# Patient Record
Sex: Female | Born: 1980 | Race: Black or African American | Hispanic: No | Marital: Married | Smoking: Never smoker
Health system: Southern US, Community
[De-identification: ages and names within clinical notes are randomized; demographics above are authoritative.]

## PROBLEM LIST (undated history)

## (undated) DIAGNOSIS — I10 Essential (primary) hypertension: Secondary | ICD-10-CM

## (undated) HISTORY — PX: MOUTH SURGERY: SHX715

---

## 2016-09-22 NOTE — L&D Delivery Note (Signed)
Patient is a 36 y.o. now G2P2003 who was admitted for IOL for pre-eclapsia, now s/p NSVD of twins at 6659w2d. S/p BMZ x 2. S/p cytotec, oxytocin, and AROM of twin A at 20:30 on 05/23/17. Received IV mag during labor.    [name], GirlA Emily Wilkinson [[id number]]  Delivery Note At 12:30 AM a viable female was delivered via Vaginal, Spontaneous Delivery (Presentation: ROA).  APGAR: 9, 9; weight 2231 g  Placenta status: intact; to pathology   Cord:  3-vessel.  Cord pH: pending  Anesthesia:  Epidural Episiotomy: None Lacerations: None Suture Repair: none Est. Blood Loss (mL): 150    [name], GirlB [name] [[id number]][[id number]]  Delivery Note At 12:56 AM a viable female was delivered via Vaginal, Vacuum (Extractor) (Presentation: ROA).  APGAR: 4, 8; weight  2129 g  Placenta status: intact   Cord:  3-vessel  Cord pH: pending  Anesthesia:  Epidural Episiotomy: None Lacerations: None Suture Repair: none Est. Blood Loss (mL): 150  Patient found to be complete, and with maternal push, Twin A delivered vertex. Head delivered ROA. No nuchal cord present. Shoulder and body delivered in usual fashion. Infant to mother's abdomen, vigorous and crying. Cord clamped and cut after 1-minute delay. Cord pH and cord blood drawn.  U/S done and Twin B found to be breech. ECV done by Dr. Despina HiddenEure. FHT reassuring. AROM of Twin B done. Mother pushed with next few contractions with minimal descent. Vacuum-assisted delivery of fetal head done, and shoulders and body delivered in usual fashion. Infant floppy and w/o spontaneous cry. Cord and clamped and cut, and infant handed to awaiting neonatology team. Cord pH and cord blood drawn.  Placenta delivered intact with gentle cord traction; 2-umbilical cords, both with 3-vessel. Fundus firm with massage and Pitocin. Perineum inspected and found to have no lacerations  Mom to postpartum.  Baby to mother's room after evaluation by neonatology team (see neonatology attendance note).  Raynelle FanningJulie P.  Shantele Reller, MD OB Fellow 05/24/17, 1:26 AM

## 2016-12-04 LAB — OB RESULTS CONSOLE GC/CHLAMYDIA
Chlamydia: NEGATIVE
GC PROBE AMP, GENITAL: NEGATIVE

## 2016-12-04 LAB — OB RESULTS CONSOLE HGB/HCT, BLOOD
HEMATOCRIT: 38
HEMOGLOBIN: 12.4

## 2016-12-04 LAB — OB RESULTS CONSOLE ABO/RH
ABO/RH(D): O POS
RH Type: POSITIVE

## 2016-12-04 LAB — OB RESULTS CONSOLE HIV ANTIBODY (ROUTINE TESTING): HIV: NONREACTIVE

## 2016-12-04 LAB — OB RESULTS CONSOLE HEPATITIS B SURFACE ANTIGEN: HEP B S AG: NEGATIVE

## 2016-12-04 LAB — OB RESULTS CONSOLE RUBELLA ANTIBODY, IGM: RUBELLA: IMMUNE

## 2016-12-04 LAB — OB RESULTS CONSOLE PLATELET COUNT: Platelets: 327

## 2016-12-04 LAB — OB RESULTS CONSOLE RPR: RPR: NONREACTIVE

## 2016-12-16 ENCOUNTER — Other Ambulatory Visit (HOSPITAL_COMMUNITY): Payer: Self-pay | Admitting: Specialist

## 2016-12-16 DIAGNOSIS — O09521 Supervision of elderly multigravida, first trimester: Secondary | ICD-10-CM

## 2016-12-16 DIAGNOSIS — Z3689 Encounter for other specified antenatal screening: Principal | ICD-10-CM

## 2016-12-16 DIAGNOSIS — O30009 Twin pregnancy, unspecified number of placenta and unspecified number of amniotic sacs, unspecified trimester: Secondary | ICD-10-CM

## 2016-12-16 DIAGNOSIS — O10919 Unspecified pre-existing hypertension complicating pregnancy, unspecified trimester: Secondary | ICD-10-CM

## 2017-01-23 ENCOUNTER — Encounter (HOSPITAL_COMMUNITY): Payer: Self-pay | Admitting: *Deleted

## 2017-01-27 ENCOUNTER — Ambulatory Visit (HOSPITAL_COMMUNITY)
Admission: RE | Admit: 2017-01-27 | Discharge: 2017-01-27 | Disposition: A | Discharge status: Home / Self Care | Payer: BLUE CROSS/BLUE SHIELD | Source: Ambulatory Visit | Attending: Specialist | Admitting: Specialist

## 2017-01-27 ENCOUNTER — Other Ambulatory Visit (HOSPITAL_COMMUNITY): Payer: Self-pay | Admitting: Specialist

## 2017-01-27 ENCOUNTER — Encounter (HOSPITAL_COMMUNITY): Payer: Self-pay

## 2017-01-27 DIAGNOSIS — O10919 Unspecified pre-existing hypertension complicating pregnancy, unspecified trimester: Secondary | ICD-10-CM

## 2017-01-27 DIAGNOSIS — O09521 Supervision of elderly multigravida, first trimester: Secondary | ICD-10-CM

## 2017-01-27 DIAGNOSIS — O30009 Twin pregnancy, unspecified number of placenta and unspecified number of amniotic sacs, unspecified trimester: Secondary | ICD-10-CM

## 2017-01-27 DIAGNOSIS — Z3689 Encounter for other specified antenatal screening: Secondary | ICD-10-CM

## 2017-01-27 DIAGNOSIS — O09522 Supervision of elderly multigravida, second trimester: Secondary | ICD-10-CM

## 2017-01-27 DIAGNOSIS — Z3A18 18 weeks gestation of pregnancy: Secondary | ICD-10-CM | POA: Insufficient documentation

## 2017-01-27 DIAGNOSIS — O30032 Twin pregnancy, monochorionic/diamniotic, second trimester: Secondary | ICD-10-CM

## 2017-01-27 HISTORY — DX: Essential (primary) hypertension: I10

## 2017-01-27 NOTE — Progress Notes (Signed)
Genetic Counseling  High-Risk Gestation Note  Appointment Date:  01/27/2017 Referred By: Jeannett Senior, MD Date of Birth:  07-Nov-1980 Partner:  Jolaine Artist   Pregnancy History: [id number] Estimated Date of Delivery: 06/26/17 Estimated Gestational Age: [redacted]w[redacted]d Attending: Particia Nearing, MD  Mrs. Viviann Spare and her husband, Mr. Terissa Haffey, were seen for genetic counseling because of a maternal age of 69.     In summary:  Discussed AMA and associated risk for fetal aneuploidy  Discussed options for screening  Quad screen-declined  NIPS-Panorama performed today  Ultrasound-performed today; no anomalies or markers for aneuploidy seen  Discussed diagnostic testing options  Amniocentesis-declined  Reviewed family history concerns  Discussed carrier screening options  CF, SMA, and hemoglobinopathies-declined  They were counseled regarding maternal age and the association with risk for chromosome conditions due to nondisjunction with aging of the ova.  This is a monochorionic-diamniotic twin pregnancy. We reviewed chromosomes, nondisjunction, and the associated 1 in 56 risk for fetal aneuploidy related to a maternal age of 10 at [redacted]w[redacted]d gestation. They were counseled that the risk for aneuploidy decreases as gestational age increases, accounting for those pregnancies which spontaneously abort.  We specifically discussed Down syndrome (trisomy 51), trisomies 65 and 64, and sex chromosome aneuploidies (47,XXX and 47,XXY) including the common features and prognoses of each.   We reviewed available screening options including Quad screen, noninvasive prenatal screening (NIPS)/cell free DNA (cfDNA) screening, and detailed ultrasound. They were counseled that screening tests are used to modify a patient's a priori risk for aneuploidy, typically based on age. This estimate provides a pregnancy specific risk assessment. We reviewed the benefits and limitations of each option. Specifically, we discussed the  conditions for which each test screens, the detection rates, and false positive rates of each. They were also counseled regarding diagnostic testing via amniocentesis. We reviewed the approximate 1 in 500 risk for complications from amniocentesis, including spontaneous pregnancy loss. We discussed the possible results that the tests might provide including: positive, negative, unanticipated, and no result. Finally, they were counseled regarding the cost of each option and potential out of pocket expenses. After consideration of all the options, they elected to proceed with NIPS. Those results will be available in 5-7 days.    The patient also expressed interest in having a detailed ultrasound. A complete ultrasound was performed today. The ultrasound report will be documented separately. There were no visualized fetal anomalies or markers suggestive of aneuploidy. Diagnostic testing was declined today. They understand that screening tests cannot rule out all birth defects or genetic syndromes. The patient was advised of this limitation and states she still does not want additional testing at this time.   Mrs. Ilg was provided with written information regarding cystic fibrosis (CF), spinal muscular atrophy (SMA) and hemoglobinopathies including the carrier frequency, availability of carrier screening and prenatal diagnosis if indicated.  In addition, we discussed that CF and hemoglobinopathies are routinely screened for as part of the [facility] newborn screening panel.  After further discussion, she declined screening for CF, SMA and hemoglobinopathies.  Both family histories were reviewed and found to be noncontributory for birth defects, intellectual disability, and known genetic conditions. Without further information regarding the provided family history, an accurate genetic risk cannot be calculated. Further genetic counseling is warranted if more information is obtained.  Mrs. Urbanski denied exposure to  environmental toxins or chemical agents. She denied the use of alcohol, tobacco or street drugs. She denied significant viral illnesses during the course of her pregnancy.  Her medical and surgical histories were noncontributory.   I counseled this couple regarding the above risks and available options.  The approximate face-to-face time with the genetic counselor was 39 minutes.  Donald Prosehristy S. Amaro Mangold, MS Certified Genetic Counselor

## 2017-01-28 ENCOUNTER — Other Ambulatory Visit (HOSPITAL_COMMUNITY): Payer: Self-pay | Admitting: *Deleted

## 2017-01-28 DIAGNOSIS — O30032 Twin pregnancy, monochorionic/diamniotic, second trimester: Secondary | ICD-10-CM

## 2017-01-30 ENCOUNTER — Encounter (HOSPITAL_COMMUNITY): Payer: Self-pay

## 2017-01-30 ENCOUNTER — Other Ambulatory Visit: Payer: Self-pay

## 2017-02-03 ENCOUNTER — Other Ambulatory Visit: Payer: Self-pay

## 2017-02-05 ENCOUNTER — Telehealth (HOSPITAL_COMMUNITY): Payer: Self-pay

## 2017-02-05 NOTE — Telephone Encounter (Signed)
Called Viviann SpareShalonda Hefley to discuss her prenatal cell free DNA test results. Mrs. Viviann SpareShalonda Gradilla had Panorama testing through [facility].  Testing was offered because of a maternal age of 36. The patient was identified by name and DOB.  We reviewed that these are within normal limits, showing a less than 1 in 10,000 risk for trisomies 21, 18 and 13, and monosomy X (Turner syndrome).  In addition, the risk for sex chromosome trisomies (47,XXX and 47,XXY) was also low risk.  Mrs. Brooke DareKing elected to have zygosity testing, which showed that the twins are monozygotic and both female. We reviewed the accuracy of the testing. She understands that this testing does not identify all genetic conditions. All questions were answered to her satisfaction, she was encouraged to call with additional questions or concerns.  Donald Prosehristy S. Neeraj Housand, MS Certified Genetic Counselor

## 2017-02-10 ENCOUNTER — Ambulatory Visit (HOSPITAL_COMMUNITY)
Admission: RE | Admit: 2017-02-10 | Discharge: 2017-02-10 | Disposition: A | Discharge status: Home / Self Care | Payer: BLUE CROSS/BLUE SHIELD | Source: Ambulatory Visit | Attending: Specialist | Admitting: Specialist

## 2017-02-10 ENCOUNTER — Encounter (HOSPITAL_COMMUNITY): Payer: Self-pay

## 2017-02-10 ENCOUNTER — Other Ambulatory Visit (HOSPITAL_COMMUNITY): Payer: Self-pay | Admitting: Maternal and Fetal Medicine

## 2017-02-10 DIAGNOSIS — Z3A20 20 weeks gestation of pregnancy: Secondary | ICD-10-CM

## 2017-02-10 DIAGNOSIS — O30032 Twin pregnancy, monochorionic/diamniotic, second trimester: Secondary | ICD-10-CM | POA: Insufficient documentation

## 2017-02-10 DIAGNOSIS — O10919 Unspecified pre-existing hypertension complicating pregnancy, unspecified trimester: Secondary | ICD-10-CM

## 2017-02-10 DIAGNOSIS — O09522 Supervision of elderly multigravida, second trimester: Secondary | ICD-10-CM | POA: Insufficient documentation

## 2017-02-10 DIAGNOSIS — O10012 Pre-existing essential hypertension complicating pregnancy, second trimester: Secondary | ICD-10-CM | POA: Insufficient documentation

## 2017-02-10 NOTE — Addendum Note (Signed)
Encounter addended by: Vivien RotaSmall, Kostantinos Tallman H, RT on: 02/10/2017  9:42 AM<BR>    Actions taken: Imaging Exam ended

## 2017-02-24 ENCOUNTER — Other Ambulatory Visit (HOSPITAL_COMMUNITY): Payer: Self-pay

## 2017-02-24 ENCOUNTER — Encounter (HOSPITAL_COMMUNITY): Payer: Self-pay

## 2017-02-27 ENCOUNTER — Encounter (HOSPITAL_COMMUNITY): Payer: Self-pay

## 2017-02-27 ENCOUNTER — Other Ambulatory Visit (HOSPITAL_COMMUNITY): Payer: Self-pay | Admitting: *Deleted

## 2017-02-27 ENCOUNTER — Ambulatory Visit (HOSPITAL_COMMUNITY)
Admission: RE | Admit: 2017-02-27 | Discharge: 2017-02-27 | Disposition: A | Discharge status: Home / Self Care | Payer: BLUE CROSS/BLUE SHIELD | Source: Ambulatory Visit | Attending: Specialist | Admitting: Specialist

## 2017-02-27 DIAGNOSIS — Z3A23 23 weeks gestation of pregnancy: Secondary | ICD-10-CM | POA: Diagnosis not present

## 2017-02-27 DIAGNOSIS — O09522 Supervision of elderly multigravida, second trimester: Secondary | ICD-10-CM | POA: Insufficient documentation

## 2017-02-27 DIAGNOSIS — O10012 Pre-existing essential hypertension complicating pregnancy, second trimester: Secondary | ICD-10-CM | POA: Diagnosis not present

## 2017-02-27 DIAGNOSIS — O30032 Twin pregnancy, monochorionic/diamniotic, second trimester: Secondary | ICD-10-CM | POA: Insufficient documentation

## 2017-02-27 DIAGNOSIS — O30039 Twin pregnancy, monochorionic/diamniotic, unspecified trimester: Secondary | ICD-10-CM

## 2017-03-13 ENCOUNTER — Ambulatory Visit (HOSPITAL_COMMUNITY)
Admission: RE | Admit: 2017-03-13 | Discharge: 2017-03-13 | Disposition: A | Discharge status: Home / Self Care | Payer: BLUE CROSS/BLUE SHIELD | Source: Ambulatory Visit | Attending: Specialist | Admitting: Specialist

## 2017-03-13 ENCOUNTER — Other Ambulatory Visit (HOSPITAL_COMMUNITY): Payer: BLUE CROSS/BLUE SHIELD

## 2017-03-13 ENCOUNTER — Encounter (HOSPITAL_COMMUNITY): Payer: Self-pay

## 2017-03-13 ENCOUNTER — Other Ambulatory Visit (HOSPITAL_COMMUNITY): Payer: Self-pay | Admitting: Maternal & Fetal Medicine

## 2017-03-13 DIAGNOSIS — O09522 Supervision of elderly multigravida, second trimester: Secondary | ICD-10-CM

## 2017-03-13 DIAGNOSIS — O30032 Twin pregnancy, monochorionic/diamniotic, second trimester: Secondary | ICD-10-CM | POA: Insufficient documentation

## 2017-03-13 DIAGNOSIS — Z3A25 25 weeks gestation of pregnancy: Secondary | ICD-10-CM | POA: Diagnosis not present

## 2017-03-13 DIAGNOSIS — O30039 Twin pregnancy, monochorionic/diamniotic, unspecified trimester: Secondary | ICD-10-CM

## 2017-03-13 DIAGNOSIS — O162 Unspecified maternal hypertension, second trimester: Secondary | ICD-10-CM

## 2017-03-27 ENCOUNTER — Ambulatory Visit (HOSPITAL_COMMUNITY)
Admission: RE | Admit: 2017-03-27 | Discharge: 2017-03-27 | Disposition: A | Discharge status: Home / Self Care | Payer: BLUE CROSS/BLUE SHIELD | Source: Ambulatory Visit | Attending: Specialist | Admitting: Specialist

## 2017-03-27 ENCOUNTER — Encounter (HOSPITAL_COMMUNITY): Payer: Self-pay

## 2017-03-27 DIAGNOSIS — Z362 Encounter for other antenatal screening follow-up: Secondary | ICD-10-CM | POA: Diagnosis not present

## 2017-03-27 DIAGNOSIS — O321XX2 Maternal care for breech presentation, fetus 2: Secondary | ICD-10-CM | POA: Diagnosis not present

## 2017-03-27 DIAGNOSIS — Z3A27 27 weeks gestation of pregnancy: Secondary | ICD-10-CM | POA: Diagnosis not present

## 2017-03-27 DIAGNOSIS — O30039 Twin pregnancy, monochorionic/diamniotic, unspecified trimester: Secondary | ICD-10-CM

## 2017-03-27 DIAGNOSIS — O30032 Twin pregnancy, monochorionic/diamniotic, second trimester: Secondary | ICD-10-CM | POA: Insufficient documentation

## 2017-03-27 DIAGNOSIS — O10019 Pre-existing essential hypertension complicating pregnancy, unspecified trimester: Secondary | ICD-10-CM | POA: Insufficient documentation

## 2017-03-27 DIAGNOSIS — O09522 Supervision of elderly multigravida, second trimester: Secondary | ICD-10-CM | POA: Diagnosis not present

## 2017-04-06 LAB — OB RESULTS CONSOLE HIV ANTIBODY (ROUTINE TESTING): HIV: NONREACTIVE

## 2017-04-06 LAB — OB RESULTS CONSOLE RPR: RPR: NONREACTIVE

## 2017-04-10 ENCOUNTER — Encounter (HOSPITAL_COMMUNITY): Payer: Self-pay

## 2017-04-10 ENCOUNTER — Ambulatory Visit (HOSPITAL_COMMUNITY): Payer: BLUE CROSS/BLUE SHIELD

## 2017-04-10 ENCOUNTER — Ambulatory Visit (HOSPITAL_COMMUNITY)
Admission: RE | Admit: 2017-04-10 | Discharge: 2017-04-10 | Disposition: A | Discharge status: Home / Self Care | Payer: BLUE CROSS/BLUE SHIELD | Source: Ambulatory Visit | Attending: Specialist | Admitting: Specialist

## 2017-04-10 DIAGNOSIS — O10013 Pre-existing essential hypertension complicating pregnancy, third trimester: Secondary | ICD-10-CM | POA: Diagnosis not present

## 2017-04-10 DIAGNOSIS — O30032 Twin pregnancy, monochorionic/diamniotic, second trimester: Secondary | ICD-10-CM | POA: Insufficient documentation

## 2017-04-10 DIAGNOSIS — O09523 Supervision of elderly multigravida, third trimester: Secondary | ICD-10-CM | POA: Diagnosis not present

## 2017-04-10 DIAGNOSIS — O30039 Twin pregnancy, monochorionic/diamniotic, unspecified trimester: Secondary | ICD-10-CM

## 2017-04-10 DIAGNOSIS — Z3A29 29 weeks gestation of pregnancy: Secondary | ICD-10-CM | POA: Diagnosis not present

## 2017-04-10 DIAGNOSIS — Z362 Encounter for other antenatal screening follow-up: Secondary | ICD-10-CM | POA: Insufficient documentation

## 2017-04-22 ENCOUNTER — Encounter (HOSPITAL_COMMUNITY): Payer: Self-pay

## 2017-04-22 ENCOUNTER — Ambulatory Visit (HOSPITAL_COMMUNITY)
Admission: RE | Admit: 2017-04-22 | Discharge: 2017-04-22 | Disposition: A | Discharge status: Home / Self Care | Payer: BLUE CROSS/BLUE SHIELD | Source: Ambulatory Visit | Attending: Maternal & Fetal Medicine | Admitting: Maternal & Fetal Medicine

## 2017-04-22 ENCOUNTER — Other Ambulatory Visit (HOSPITAL_COMMUNITY): Payer: Self-pay | Admitting: Maternal & Fetal Medicine

## 2017-04-22 DIAGNOSIS — O10013 Pre-existing essential hypertension complicating pregnancy, third trimester: Secondary | ICD-10-CM | POA: Insufficient documentation

## 2017-04-22 DIAGNOSIS — O30039 Twin pregnancy, monochorionic/diamniotic, unspecified trimester: Secondary | ICD-10-CM

## 2017-04-22 DIAGNOSIS — O30033 Twin pregnancy, monochorionic/diamniotic, third trimester: Secondary | ICD-10-CM | POA: Insufficient documentation

## 2017-04-22 DIAGNOSIS — Z3A30 30 weeks gestation of pregnancy: Secondary | ICD-10-CM

## 2017-04-22 DIAGNOSIS — O09523 Supervision of elderly multigravida, third trimester: Secondary | ICD-10-CM | POA: Insufficient documentation

## 2017-04-23 ENCOUNTER — Ambulatory Visit (HOSPITAL_COMMUNITY): Payer: BLUE CROSS/BLUE SHIELD

## 2017-04-24 ENCOUNTER — Ambulatory Visit (HOSPITAL_COMMUNITY): Payer: BLUE CROSS/BLUE SHIELD

## 2017-05-08 ENCOUNTER — Encounter (HOSPITAL_COMMUNITY): Payer: Self-pay

## 2017-05-08 ENCOUNTER — Ambulatory Visit (HOSPITAL_COMMUNITY): Payer: BLUE CROSS/BLUE SHIELD

## 2017-05-08 ENCOUNTER — Ambulatory Visit (HOSPITAL_COMMUNITY)
Admission: RE | Admit: 2017-05-08 | Discharge: 2017-05-08 | Disposition: A | Discharge status: Home / Self Care | Payer: BLUE CROSS/BLUE SHIELD | Source: Ambulatory Visit | Attending: Specialist | Admitting: Specialist

## 2017-05-08 DIAGNOSIS — O30039 Twin pregnancy, monochorionic/diamniotic, unspecified trimester: Secondary | ICD-10-CM

## 2017-05-08 DIAGNOSIS — O09523 Supervision of elderly multigravida, third trimester: Secondary | ICD-10-CM | POA: Diagnosis not present

## 2017-05-08 DIAGNOSIS — O321XX1 Maternal care for breech presentation, fetus 1: Secondary | ICD-10-CM | POA: Diagnosis not present

## 2017-05-08 DIAGNOSIS — Z3A33 33 weeks gestation of pregnancy: Secondary | ICD-10-CM | POA: Diagnosis not present

## 2017-05-08 DIAGNOSIS — O10013 Pre-existing essential hypertension complicating pregnancy, third trimester: Secondary | ICD-10-CM | POA: Diagnosis present

## 2017-05-08 DIAGNOSIS — O30033 Twin pregnancy, monochorionic/diamniotic, third trimester: Secondary | ICD-10-CM | POA: Diagnosis present

## 2017-05-08 NOTE — Addendum Note (Signed)
Encounter addended by: Drue Novel, RDMS on: 05/08/2017  4:19 PM<BR>    Actions taken: Imaging Exam ended

## 2017-05-22 ENCOUNTER — Inpatient Hospital Stay (HOSPITAL_COMMUNITY)
Admission: AD | Admit: 2017-05-22 | Discharge: 2017-05-25 | DRG: 767 | Disposition: A | Discharge status: Home / Self Care | Payer: BLUE CROSS/BLUE SHIELD | Source: Ambulatory Visit | Attending: Obstetrics & Gynecology | Admitting: Obstetrics & Gynecology

## 2017-05-22 ENCOUNTER — Ambulatory Visit (HOSPITAL_COMMUNITY): Payer: BLUE CROSS/BLUE SHIELD

## 2017-05-22 ENCOUNTER — Encounter (HOSPITAL_COMMUNITY): Payer: Self-pay

## 2017-05-22 ENCOUNTER — Other Ambulatory Visit (HOSPITAL_COMMUNITY): Payer: Self-pay | Admitting: Maternal & Fetal Medicine

## 2017-05-22 ENCOUNTER — Ambulatory Visit (HOSPITAL_COMMUNITY)
Admission: RE | Admit: 2017-05-22 | Discharge: 2017-05-22 | Disposition: A | Discharge status: Home / Self Care | Payer: BLUE CROSS/BLUE SHIELD | Source: Ambulatory Visit | Attending: Maternal & Fetal Medicine | Admitting: Maternal & Fetal Medicine

## 2017-05-22 DIAGNOSIS — O30009 Twin pregnancy, unspecified number of placenta and unspecified number of amniotic sacs, unspecified trimester: Secondary | ICD-10-CM

## 2017-05-22 DIAGNOSIS — O1002 Pre-existing essential hypertension complicating childbirth: Secondary | ICD-10-CM | POA: Diagnosis present

## 2017-05-22 DIAGNOSIS — Z302 Encounter for sterilization: Secondary | ICD-10-CM | POA: Diagnosis not present

## 2017-05-22 DIAGNOSIS — O114 Pre-existing hypertension with pre-eclampsia, complicating childbirth: Secondary | ICD-10-CM | POA: Diagnosis present

## 2017-05-22 DIAGNOSIS — O1494 Unspecified pre-eclampsia, complicating childbirth: Secondary | ICD-10-CM | POA: Diagnosis present

## 2017-05-22 DIAGNOSIS — O30033 Twin pregnancy, monochorionic/diamniotic, third trimester: Secondary | ICD-10-CM | POA: Diagnosis present

## 2017-05-22 DIAGNOSIS — O10019 Pre-existing essential hypertension complicating pregnancy, unspecified trimester: Secondary | ICD-10-CM

## 2017-05-22 DIAGNOSIS — O30043 Twin pregnancy, dichorionic/diamniotic, third trimester: Secondary | ICD-10-CM | POA: Diagnosis not present

## 2017-05-22 DIAGNOSIS — Z3A49 Greater than 42 weeks gestation of pregnancy: Secondary | ICD-10-CM | POA: Diagnosis not present

## 2017-05-22 DIAGNOSIS — Z3A35 35 weeks gestation of pregnancy: Secondary | ICD-10-CM

## 2017-05-22 DIAGNOSIS — O30039 Twin pregnancy, monochorionic/diamniotic, unspecified trimester: Secondary | ICD-10-CM

## 2017-05-22 DIAGNOSIS — O09523 Supervision of elderly multigravida, third trimester: Secondary | ICD-10-CM | POA: Diagnosis present

## 2017-05-22 DIAGNOSIS — O321XX2 Maternal care for breech presentation, fetus 2: Secondary | ICD-10-CM | POA: Diagnosis present

## 2017-05-22 DIAGNOSIS — O1414 Severe pre-eclampsia complicating childbirth: Secondary | ICD-10-CM | POA: Diagnosis not present

## 2017-05-22 DIAGNOSIS — O1410 Severe pre-eclampsia, unspecified trimester: Secondary | ICD-10-CM | POA: Diagnosis present

## 2017-05-22 DIAGNOSIS — Z3490 Encounter for supervision of normal pregnancy, unspecified, unspecified trimester: Secondary | ICD-10-CM | POA: Diagnosis present

## 2017-05-22 LAB — COMPREHENSIVE METABOLIC PANEL
ALK PHOS: 191 U/L — AB (ref 38–126)
ALT: 16 U/L (ref 14–54)
AST: 23 U/L (ref 15–41)
Albumin: 2.9 g/dL — ABNORMAL LOW (ref 3.5–5.0)
Anion gap: 9 (ref 5–15)
BUN: 8 mg/dL (ref 6–20)
CALCIUM: 9.2 mg/dL (ref 8.9–10.3)
CO2: 19 mmol/L — ABNORMAL LOW (ref 22–32)
CREATININE: 0.51 mg/dL (ref 0.44–1.00)
Chloride: 108 mmol/L (ref 101–111)
Glucose, Bld: 74 mg/dL (ref 65–99)
Potassium: 4.2 mmol/L (ref 3.5–5.1)
Sodium: 136 mmol/L (ref 135–145)
TOTAL PROTEIN: 6 g/dL — AB (ref 6.5–8.1)
Total Bilirubin: 0.3 mg/dL (ref 0.3–1.2)

## 2017-05-22 LAB — CBC
HEMATOCRIT: 36.8 % (ref 36.0–46.0)
Hemoglobin: 12.1 g/dL (ref 12.0–15.0)
MCH: 26.9 pg (ref 26.0–34.0)
MCHC: 32.9 g/dL (ref 30.0–36.0)
MCV: 82 fL (ref 78.0–100.0)
PLATELETS: 215 10*3/uL (ref 150–400)
RBC: 4.49 MIL/uL (ref 3.87–5.11)
RDW: 14 % (ref 11.5–15.5)
WBC: 9.5 10*3/uL (ref 4.0–10.5)

## 2017-05-22 LAB — PROTEIN / CREATININE RATIO, URINE
Creatinine, Urine: 55 mg/dL
PROTEIN CREATININE RATIO: 0.15 mg/mg{creat} (ref 0.00–0.15)
Total Protein, Urine: 8 mg/dL

## 2017-05-22 LAB — TYPE AND SCREEN
ABO/RH(D): O POS
ANTIBODY SCREEN: NEGATIVE

## 2017-05-22 LAB — ABO/RH: ABO/RH(D): O POS

## 2017-05-22 MED ORDER — DEXTROSE 5 % IV SOLN
5.0000 10*6.[IU] | Freq: Once | INTRAVENOUS | Status: AC
  Administered 2017-05-22: 5 10*6.[IU] via INTRAVENOUS
  Filled 2017-05-22: qty 5

## 2017-05-22 MED ORDER — BETAMETHASONE SOD PHOS & ACET 6 (3-3) MG/ML IJ SUSP
12.0000 mg | INTRAMUSCULAR | Status: AC
  Administered 2017-05-22 – 2017-05-23 (×2): 12 mg via INTRAMUSCULAR
  Filled 2017-05-22 (×2): qty 2

## 2017-05-22 MED ORDER — LABETALOL HCL 5 MG/ML IV SOLN: 20.0000 mg | INTRAVENOUS | Status: DC | PRN

## 2017-05-22 MED ORDER — MAGNESIUM SULFATE BOLUS VIA INFUSION
4.0000 g | Freq: Once | INTRAVENOUS | Status: AC
  Administered 2017-05-22: 4 g via INTRAVENOUS
  Filled 2017-05-22: qty 500

## 2017-05-22 MED ORDER — HYDRALAZINE HCL 20 MG/ML IJ SOLN: 5.0000 mg | INTRAMUSCULAR | Status: DC | PRN

## 2017-05-22 MED ORDER — ACETAMINOPHEN 325 MG PO TABS: 650.0000 mg | ORAL_TABLET | ORAL | Status: DC | PRN

## 2017-05-22 MED ORDER — LIDOCAINE HCL (PF) 1 % IJ SOLN
30.0000 mL | INTRAMUSCULAR | Status: DC | PRN
  Filled 2017-05-22: qty 30

## 2017-05-22 MED ORDER — SOD CITRATE-CITRIC ACID 500-334 MG/5ML PO SOLN: 30.0000 mL | ORAL | Status: DC | PRN

## 2017-05-22 MED ORDER — LACTATED RINGERS IV SOLN
INTRAVENOUS | Status: DC
  Administered 2017-05-22 – 2017-05-24 (×4): via INTRAVENOUS

## 2017-05-22 MED ORDER — OXYTOCIN BOLUS FROM INFUSION
500.0000 mL | Freq: Once | INTRAVENOUS | Status: AC
  Administered 2017-05-24: 500 mL via INTRAVENOUS

## 2017-05-22 MED ORDER — OXYCODONE-ACETAMINOPHEN 5-325 MG PO TABS: 2.0000 | ORAL_TABLET | ORAL | Status: DC | PRN

## 2017-05-22 MED ORDER — OXYTOCIN 40 UNITS IN LACTATED RINGERS INFUSION - SIMPLE MED
2.5000 [IU]/h | INTRAVENOUS | Status: DC
  Filled 2017-05-22: qty 1000

## 2017-05-22 MED ORDER — MAGNESIUM SULFATE 40 G IN LACTATED RINGERS - SIMPLE
2.0000 g/h | INTRAVENOUS | Status: DC
  Administered 2017-05-23: 2 g/h via INTRAVENOUS
  Filled 2017-05-22 (×2): qty 500

## 2017-05-22 MED ORDER — HYDRALAZINE HCL 20 MG/ML IJ SOLN: 10.0000 mg | Freq: Once | INTRAMUSCULAR | Status: DC | PRN

## 2017-05-22 MED ORDER — TERBUTALINE SULFATE 1 MG/ML IJ SOLN
0.2500 mg | Freq: Once | INTRAMUSCULAR | Status: DC | PRN
  Filled 2017-05-22 (×2): qty 1

## 2017-05-22 MED ORDER — OXYCODONE-ACETAMINOPHEN 5-325 MG PO TABS: 1.0000 | ORAL_TABLET | ORAL | Status: DC | PRN

## 2017-05-22 MED ORDER — PENICILLIN G POT IN DEXTROSE 60000 UNIT/ML IV SOLN
3.0000 10*6.[IU] | INTRAVENOUS | Status: DC
  Administered 2017-05-23 (×6): 3 10*6.[IU] via INTRAVENOUS
  Filled 2017-05-22 (×10): qty 50

## 2017-05-22 MED ORDER — FENTANYL CITRATE (PF) 100 MCG/2ML IJ SOLN: 100.0000 ug | INTRAMUSCULAR | Status: DC | PRN

## 2017-05-22 MED ORDER — LABETALOL HCL 5 MG/ML IV SOLN
20.0000 mg | INTRAVENOUS | Status: DC | PRN
Start: 2017-05-22 — End: 2017-05-25

## 2017-05-22 MED ORDER — LACTATED RINGERS IV SOLN: 500.0000 mL | INTRAVENOUS | Status: DC | PRN

## 2017-05-22 MED ORDER — ONDANSETRON HCL 4 MG/2ML IJ SOLN: 4.0000 mg | Freq: Four times a day (QID) | INTRAMUSCULAR | Status: DC | PRN

## 2017-05-22 NOTE — ED Notes (Signed)
Report given to L&D charge, Birdena CrandallRainey.  Pt/FOB ambulated to room 172.

## 2017-05-22 NOTE — Anesthesia Pain Management Evaluation Note (Signed)
  CRNA Pain Management Visit Note  Patient: Emily Wilkinson, 36 y.o., female  "Hello I am a [facility]. We have an anesthesia team available at all times to provide care throughout the hospital, including epidural management and anesthesia for C-section. I don't know your plan for the delivery whether it a natural birth, water birth, IV sedation, nitrous supplementation, doula or epidural, but we want to meet your pain goals."   1.Was your pain managed to your expectations on prior hospitalizations?   Yes   2.What is your expectation for pain management during this hospitalization?     Epidural  3.How can we help you reach that goal? Epidural when patient reaches pain threshold.  Record the patient's initial score and the patient's pain goal.   Pain: 3  Pain Goal: 6 [facility] wants you to be able to say your pain was always managed very well.  Emily Wilkinson 05/22/2017

## 2017-05-22 NOTE — H&P (Signed)
Emily Wilkinson is a 36 y.o. female presenting for IOL due to onset of pre-e with severe features (BP). Denies H/A, RUQ pain or visual disturbances. No bleeding or leaking, but ctx began stronger this afternoon. [facility] and was at a routine office visit today when elevated BPs were noted. Pt had elevated BP in her first preg (vag del), and reports having regular visits with a family physician since then and has had nl BPs. Prenatal records rev'd: elevated glucola with nl 3h GTT; fetal echo evaluating Twin B with small VSD and pericardial effusion; previous SVD.  OB History    Gravida Para Term Preterm AB Living   2 1 1     1    SAB TAB Ectopic Multiple Live Births                 Past Medical History:  Diagnosis Date  . Hypertension    Past Surgical History:  Procedure Laterality Date  . MOUTH SURGERY     Family History: family history is not on file. Social History:  reports that she has never smoked. She has never used smokeless tobacco. She reports that she does not drink alcohol or use drugs.     Maternal Diabetes: No Genetic Screening: Normal Maternal Ultrasounds/Referrals: Normal Fetal Ultrasounds or other Referrals:  None Maternal Substance Abuse:  No Significant Maternal Medications:  None Significant Maternal Lab Results:  Lab values include: Other: GBS unknown Other Comments:  mono/di twins- Twin B with sm VSD & pericardial effusion  ROS History Dilation: 1.5 Effacement (%): 80 Station: -2 Exam by:: K Shaw CNM Blood pressure (!) 158/90, pulse 95, temperature 97.9 F (36.6 C), temperature source Oral, resp. rate 18, height 5\' 3"  (1.6 m), weight 99.1 kg (218 lb 6 oz), last menstrual period 09/19/2016. Exam Physical Exam  Constitutional: She is oriented to person, place, and time. She appears well-developed.  HENT:  Head: Normocephalic.  Neck: Normal range of motion.  Cardiovascular: Normal rate.   Respiratory: Effort normal.  GI:  EFM  Twins: both 140s, +LTV, +accels, no decels Ctx q 1-4 mins  Genitourinary: Vagina normal.  Musculoskeletal: Normal range of motion. She exhibits edema.  Neurological: She is alert and oriented to person, place, and time.  Skin: Skin is warm and dry.  Psychiatric: She has a normal mood and affect. Her behavior is normal. Thought content normal.    CBC    Component Value Date/Time   WBC 9.5 05/22/2017 1814   RBC 4.49 05/22/2017 1814   HGB 12.1 05/22/2017 1814   HGB 12.4 12/04/2016   HCT 36.8 05/22/2017 1814   HCT 38 12/04/2016   PLT 215 05/22/2017 1814   PLT 327 12/04/2016   MCV 82.0 05/22/2017 1814   MCH 26.9 05/22/2017 1814   MCHC 32.9 05/22/2017 1814   RDW 14.0 05/22/2017 1814   CMP     Component Value Date/Time   NA 136 05/22/2017 1814   K 4.2 05/22/2017 1814   CL 108 05/22/2017 1814   CO2 19 (L) 05/22/2017 1814   GLUCOSE 74 05/22/2017 1814   BUN 8 05/22/2017 1814   CREATININE 0.51 05/22/2017 1814   CALCIUM 9.2 05/22/2017 1814   PROT 6.0 (L) 05/22/2017 1814   ALBUMIN 2.9 (L) 05/22/2017 1814   AST 23 05/22/2017 1814   ALT 16 05/22/2017 1814   ALKPHOS 191 (H) 05/22/2017 1814   BILITOT 0.3 05/22/2017 1814   GFRNONAA >60 05/22/2017 1814  GFRAA >60 05/22/2017 1814   P/C ratio: 0.15  Prenatal labs: ABO, Rh: --/--/O POS, O POS (08/31 1814) Antibody: NEG (08/31 1814) Rubella: Immune (03/15 0000) RPR: Nonreactive (07/16 0000)  HBsAg: Negative (03/15 0000)  HIV: Non-reactive (07/16 0000)  GBS:   unknown  Assessment/Plan: Twin IUP@35 .0wks Pre-e with severe features (BPs) Possible onset of spont PTL GBS unknown  Admit to [employer] Since pt is contracting regularly and painfully, we will management expectantly for now and will use IOL methods if needed Begin mag sulfate & PCN BMZ Use antihypertensives prn severe range BPs Anticipate SVD- discussed possible need for double set up delivery in OR   SHAW, KIMBERLY CNM 05/22/2017, 10:39 PM

## 2017-05-23 ENCOUNTER — Inpatient Hospital Stay (HOSPITAL_COMMUNITY): Payer: BLUE CROSS/BLUE SHIELD | Admitting: Anesthesiology

## 2017-05-23 LAB — RPR: RPR Ser Ql: NONREACTIVE

## 2017-05-23 MED ORDER — EPHEDRINE 5 MG/ML INJ
10.0000 mg | INTRAVENOUS | Status: DC | PRN
  Filled 2017-05-23: qty 2

## 2017-05-23 MED ORDER — PHENYLEPHRINE 40 MCG/ML (10ML) SYRINGE FOR IV PUSH (FOR BLOOD PRESSURE SUPPORT)
80.0000 ug | PREFILLED_SYRINGE | INTRAVENOUS | Status: DC | PRN
  Filled 2017-05-23: qty 10
  Filled 2017-05-23: qty 5

## 2017-05-23 MED ORDER — TERBUTALINE SULFATE 1 MG/ML IJ SOLN: 0.2500 mg | Freq: Once | INTRAMUSCULAR | Status: DC | PRN

## 2017-05-23 MED ORDER — FENTANYL 2.5 MCG/ML BUPIVACAINE 1/10 % EPIDURAL INFUSION (WH - ANES)
14.0000 mL/h | INTRAMUSCULAR | Status: DC | PRN
  Administered 2017-05-23 (×2): 14 mL/h via EPIDURAL
  Filled 2017-05-23: qty 100

## 2017-05-23 MED ORDER — MISOPROSTOL 25 MCG QUARTER TABLET
25.0000 ug | ORAL_TABLET | ORAL | Status: DC | PRN
  Administered 2017-05-23 (×2): 25 ug via VAGINAL
  Filled 2017-05-23 (×3): qty 1

## 2017-05-23 MED ORDER — ZOLPIDEM TARTRATE 5 MG PO TABS
5.0000 mg | ORAL_TABLET | Freq: Every evening | ORAL | Status: DC | PRN
  Administered 2017-05-23: 5 mg via ORAL
  Filled 2017-05-23: qty 1

## 2017-05-23 MED ORDER — PHENYLEPHRINE 40 MCG/ML (10ML) SYRINGE FOR IV PUSH (FOR BLOOD PRESSURE SUPPORT)
PREFILLED_SYRINGE | INTRAVENOUS | Status: AC
  Filled 2017-05-23: qty 20

## 2017-05-23 MED ORDER — LACTATED RINGERS IV SOLN: 500.0000 mL | Freq: Once | INTRAVENOUS | Status: AC

## 2017-05-23 MED ORDER — LIDOCAINE HCL (PF) 1 % IJ SOLN
INTRAMUSCULAR | Status: DC | PRN
  Administered 2017-05-23 (×2): 5 mL via EPIDURAL

## 2017-05-23 MED ORDER — OXYTOCIN 40 UNITS IN LACTATED RINGERS INFUSION - SIMPLE MED
1.0000 m[IU]/min | INTRAVENOUS | Status: DC
  Administered 2017-05-23: 2 m[IU]/min via INTRAVENOUS

## 2017-05-23 MED ORDER — DIPHENHYDRAMINE HCL 50 MG/ML IJ SOLN
12.5000 mg | INTRAMUSCULAR | Status: DC | PRN
  Administered 2017-05-23 (×2): 12.5 mg via INTRAVENOUS
  Filled 2017-05-23: qty 1

## 2017-05-23 MED ORDER — PHENYLEPHRINE 40 MCG/ML (10ML) SYRINGE FOR IV PUSH (FOR BLOOD PRESSURE SUPPORT)
80.0000 ug | PREFILLED_SYRINGE | INTRAVENOUS | Status: DC | PRN
  Administered 2017-05-23: 80 ug via INTRAVENOUS
  Filled 2017-05-23 (×2): qty 5

## 2017-05-23 MED ORDER — FENTANYL 2.5 MCG/ML BUPIVACAINE 1/10 % EPIDURAL INFUSION (WH - ANES)
INTRAMUSCULAR | Status: AC
  Filled 2017-05-23: qty 100

## 2017-05-23 NOTE — Anesthesia Preprocedure Evaluation (Signed)
Anesthesia Evaluation  Patient identified by MRN, date of birth, ID band Patient awake    Reviewed: Allergy & Precautions, H&P , NPO status , Patient's Chart, lab work & pertinent test results, reviewed documented beta blocker date and time   Airway Mallampati: I  TM Distance: >3 FB Neck ROM: full    Dental no notable dental hx.    Pulmonary neg pulmonary ROS,    Pulmonary exam normal breath sounds clear to auscultation       Cardiovascular hypertension, On Medications negative cardio ROS Normal cardiovascular exam Rhythm:regular Rate:Normal     Neuro/Psych negative neurological ROS  negative psych ROS   GI/Hepatic negative GI ROS, Neg liver ROS,   Endo/Other  negative endocrine ROS  Renal/GU negative Renal ROS  negative genitourinary   Musculoskeletal   Abdominal   Peds  Hematology negative hematology ROS (+)   Anesthesia Other Findings   Reproductive/Obstetrics (+) Pregnancy                             Anesthesia Physical Anesthesia Plan  ASA: III  Anesthesia Plan: Epidural   Post-op Pain Management:    Induction:   PONV Risk Score and Plan:   Airway Management Planned:   Additional Equipment:   Intra-op Plan:   Post-operative Plan:   Informed Consent: I have reviewed the patients History and Physical, chart, labs and discussed the procedure including the risks, benefits and alternatives for the proposed anesthesia with the patient or authorized representative who has indicated his/her understanding and acceptance.     Plan Discussed with:   Anesthesia Plan Comments:         Anesthesia Quick Evaluation

## 2017-05-23 NOTE — Progress Notes (Signed)
LABOR PROGRESS NOTE  Emily Wilkinson is a 36 y.o. G2P1001 with twin IUP at 457w1d  admitted for IOL for severe pre E.  Subjective: Reports feeling contractions that are getting stronger but does not need an epidural right now.  Denies any other concerns  Objective: BP (!) 153/93   Pulse 97   Temp 98 F (36.7 C) (Oral)   Resp 16   Ht 5\' 3"  (1.6 m)   Wt 99.1 kg (218 lb 6 oz)   LMP 09/19/2016   SpO2 100%   BMI 38.68 kg/m  or  Vitals:   05/23/17 1300 05/23/17 1330 05/23/17 1400 05/23/17 1430  BP: (!) 142/73 (!) 149/98 (!) 146/85 (!) 153/93  Pulse: 97 (!) 105 100 97  Resp: 16 16 16 16   Temp:      TempSrc:      SpO2:      Weight:      Height:        Last SVE Dilation: 3 Effacement (%): 80 Station: -2 Presentation: Vertex Exam by:: J.Follmer,RNC FHT: baseline rate 145 and 150, moderate varibility, +acel,  No decel, Twin A and B Toco: ctx q 1.5-2 min  Labs: Lab Results  Component Value Date   WBC 9.5 05/22/2017   HGB 12.1 05/22/2017   HCT 36.8 05/22/2017   MCV 82.0 05/22/2017   PLT 215 05/22/2017    Assessment / Plan: 36 y.o. G2P1001 with twin IUP at 857w1d here for IOL for severe PreE. On IV Mag  Labor: Currently on 386milli-units/min pitocin Fetal Wellbeing:  Cat I Pain Control:  Per patient's request Anticipated MOD:  SVD  Lennox SoldersAmanda C Winfrey, MD 05/23/2017, 2:56 PM

## 2017-05-23 NOTE — Progress Notes (Signed)
LABOR PROGRESS NOTE  Viviann SpareShalonda Dobbin is a 36 y.o. G2P1001 with twin IUP at 4051w1d  admitted for IOL for severe preE.  Subjective: Patient denies any concern at this time. Pain well-controlled with epidural.  Objective: BP (!) 123/96   Pulse 90   Temp 97.9 F (36.6 C) (Oral)   Resp 16   Ht 5\' 3"  (1.6 m)   Wt 218 lb 6 oz (99.1 kg)   LMP 09/19/2016   SpO2 100%   BMI 38.68 kg/m  or  Vitals:   05/23/17 1730 05/23/17 1800 05/23/17 1830 05/23/17 1900  BP: (!) 148/84 (!) 158/90 132/72 (!) 123/96  Pulse: 89 90 87 90  Resp: 16 16 16 16   Temp:      TempSrc:      SpO2:      Weight:      Height:        SVE: 4/80/-2 FHT: baseline rate 35, moderate varibility, 10 x10 acel,  No decel, Twin A and B Toco: ctx q 2-3 min  Assessment / Plan: 36 y.o. G2P1001 with twin IUP at 5751w1d here for IOL for severe PreE. On IV Mag, so signs/symptoms of toxicity.   Labor: AROM of twin A with clear fluid. Continue to titrate IV Pit Fetal Wellbeing:  Cat I Pain Control:  Epidural Anticipated MOD:  SVD  Frederik PearJulie P Degele, MD 05/23/2017, 8:41 PM

## 2017-05-23 NOTE — Anesthesia Procedure Notes (Signed)
Epidural Patient location during procedure: OB Start time: 05/23/2017 4:00 PM End time: 05/23/2017 4:12 PM  Staffing Anesthesiologist: Burnice Vassel  Preanesthetic Checklist Completed: patient identified, site marked, surgical consent, pre-op evaluation, timeout performed, IV checked, risks and benefits discussed and monitors and equipment checked  Epidural Patient position: sitting Prep: site prepped and draped and DuraPrep Patient monitoring: continuous pulse ox and blood pressure Approach: midline Location: L3-L4 Injection technique: LOR air  Needle:  Needle type: Tuohy  Needle gauge: 17 G Needle length: 9 cm and 9 Needle insertion depth: 8 cm Catheter type: closed end flexible Catheter size: 19 Gauge Catheter at skin depth: 13 cm Test dose: negative  Assessment Events: blood not aspirated, injection not painful, no injection resistance, negative IV test and no paresthesia

## 2017-05-23 NOTE — Progress Notes (Signed)
LABOR PROGRESS NOTE  Emily Wilkinson is a 36 y.o. G2P1001 with twin IUP at 5477w1d  admitted for IOL for severe pre E.  Subjective: Reports felling contractions, but tolerating well. Denies any other concerns  Objective: BP (!) 156/89   Pulse (!) 109   Temp 97.8 F (36.6 C) (Oral)   Resp 16   Ht 5\' 3"  (1.6 m)   Wt 218 lb 6 oz (99.1 kg)   LMP 09/19/2016   SpO2 100%   BMI 38.68 kg/m  or  Vitals:   05/23/17 0800 05/23/17 0900 05/23/17 1000 05/23/17 1005  BP: (!) 156/91 (!) 166/92 (!) 156/89   Pulse: 90 (!) 101 (!) 109   Resp: 16 16 16    Temp:      TempSrc:      SpO2:    100%  Weight:      Height:        Last SVE Dilation: 3 Effacement (%): 80 Station: -2 Presentation: Vertex Exam by:: J.Follmer,RNC FHT: baseline rate 150, moderate varibility, +acel,  No decel, Twin A and B Toco: ctx q 2-3 min  Labs: Lab Results  Component Value Date   WBC 9.5 05/22/2017   HGB 12.1 05/22/2017   HCT 36.8 05/22/2017   MCV 82.0 05/22/2017   PLT 215 05/22/2017    Assessment / Plan: 36 y.o. G2P1001 with twin IUP at 3377w1d here for IOL for severe PreE. On IV Mag  Labor: Will start IV Pitocin Fetal Wellbeing:  Cat I Pain Control:  Per patient's request Anticipated MOD:  SVD  Frederik PearJulie P Degele, MD 05/23/2017, 11:26 AM

## 2017-05-23 NOTE — Progress Notes (Signed)
Patient ID: Emily Wilkinson, female   DOB: 22-Aug-1981, 36 y.o.   MRN: [id number]  Having some cramping, but doing well; no s/s pre-e;  s/p 2nd dose cytotec  BP 156/91, other VSS FHR A 135, +accels, no decels, +LTV; B 130s, +accels, no decels, +LTV Ctx q 2-4 mins Cx deferred (was 2-3/70/-1/-2)  Twin IUP@35 .1wks IOL process Severe pre-e GBS unk  Will consider starting Pit 4 hrs after last cytotec dosing Hopeful for vag del Antihypertensives IV prn severe range BPs  Wille Aubuchon CNM 05/23/2017 9:58 AM

## 2017-05-24 ENCOUNTER — Encounter (HOSPITAL_COMMUNITY)
Admission: AD | Disposition: A | Discharge status: Home / Self Care | Payer: Self-pay | Source: Ambulatory Visit | Attending: Obstetrics & Gynecology

## 2017-05-24 ENCOUNTER — Inpatient Hospital Stay (HOSPITAL_COMMUNITY): Payer: BLUE CROSS/BLUE SHIELD | Admitting: Anesthesiology

## 2017-05-24 ENCOUNTER — Encounter (HOSPITAL_COMMUNITY): Payer: Self-pay

## 2017-05-24 DIAGNOSIS — Z3A49 Greater than 42 weeks gestation of pregnancy: Secondary | ICD-10-CM

## 2017-05-24 DIAGNOSIS — O30043 Twin pregnancy, dichorionic/diamniotic, third trimester: Secondary | ICD-10-CM

## 2017-05-24 DIAGNOSIS — O1414 Severe pre-eclampsia complicating childbirth: Secondary | ICD-10-CM

## 2017-05-24 HISTORY — PX: TUBAL LIGATION: SHX77

## 2017-05-24 SURGERY — LIGATION, FALLOPIAN TUBE, POSTPARTUM
Anesthesia: General | Site: Abdomen | Laterality: Bilateral

## 2017-05-24 MED ORDER — ONDANSETRON HCL 4 MG PO TABS: 4.0000 mg | ORAL_TABLET | ORAL | Status: DC | PRN

## 2017-05-24 MED ORDER — FAMOTIDINE 20 MG PO TABS
40.0000 mg | ORAL_TABLET | Freq: Once | ORAL | Status: AC
  Administered 2017-05-24: 40 mg via ORAL
  Filled 2017-05-24: qty 2

## 2017-05-24 MED ORDER — KETOROLAC TROMETHAMINE 30 MG/ML IJ SOLN
30.0000 mg | Freq: Once | INTRAMUSCULAR | Status: DC | PRN
  Administered 2017-05-24: 30 mg via INTRAVENOUS

## 2017-05-24 MED ORDER — OXYCODONE HCL 5 MG PO TABS: 5.0000 mg | ORAL_TABLET | Freq: Once | ORAL | Status: DC | PRN

## 2017-05-24 MED ORDER — SENNOSIDES-DOCUSATE SODIUM 8.6-50 MG PO TABS
2.0000 | ORAL_TABLET | ORAL | Status: DC
  Administered 2017-05-25: 2 via ORAL
  Filled 2017-05-24: qty 2

## 2017-05-24 MED ORDER — LIDOCAINE-EPINEPHRINE (PF) 2 %-1:200000 IJ SOLN
INTRAMUSCULAR | Status: AC
  Filled 2017-05-24: qty 20

## 2017-05-24 MED ORDER — MIDAZOLAM HCL 5 MG/5ML IJ SOLN
INTRAMUSCULAR | Status: DC | PRN
  Administered 2017-05-24: 2 mg via INTRAVENOUS

## 2017-05-24 MED ORDER — METOCLOPRAMIDE HCL 10 MG PO TABS
10.0000 mg | ORAL_TABLET | Freq: Once | ORAL | Status: AC
  Administered 2017-05-24: 10 mg via ORAL
  Filled 2017-05-24: qty 1

## 2017-05-24 MED ORDER — SODIUM BICARBONATE 8.4 % IV SOLN
INTRAVENOUS | Status: AC
  Filled 2017-05-24: qty 50

## 2017-05-24 MED ORDER — ONDANSETRON HCL 4 MG/2ML IJ SOLN
INTRAMUSCULAR | Status: DC | PRN
  Administered 2017-05-24: 4 mg via INTRAVENOUS

## 2017-05-24 MED ORDER — TETANUS-DIPHTH-ACELL PERTUSSIS 5-2.5-18.5 LF-MCG/0.5 IM SUSP: 0.5000 mL | Freq: Once | INTRAMUSCULAR | Status: DC

## 2017-05-24 MED ORDER — IBUPROFEN 600 MG PO TABS
600.0000 mg | ORAL_TABLET | Freq: Four times a day (QID) | ORAL | Status: DC
  Administered 2017-05-24 – 2017-05-25 (×5): 600 mg via ORAL
  Filled 2017-05-24 (×5): qty 1

## 2017-05-24 MED ORDER — PHENYLEPHRINE HCL 10 MG/ML IJ SOLN
INTRAMUSCULAR | Status: DC | PRN
  Administered 2017-05-24 (×2): 80 ug via INTRAVENOUS

## 2017-05-24 MED ORDER — COCONUT OIL OIL
1.0000 | TOPICAL_OIL | Status: DC | PRN
Start: 2017-05-24 — End: 2017-05-25
  Filled 2017-05-24: qty 120

## 2017-05-24 MED ORDER — FENTANYL CITRATE (PF) 100 MCG/2ML IJ SOLN
INTRAMUSCULAR | Status: DC | PRN
  Administered 2017-05-24: 100 ug via INTRAVENOUS

## 2017-05-24 MED ORDER — FENTANYL CITRATE (PF) 100 MCG/2ML IJ SOLN
INTRAMUSCULAR | Status: AC
  Filled 2017-05-24: qty 2

## 2017-05-24 MED ORDER — HYDROMORPHONE HCL 1 MG/ML IJ SOLN: 0.2500 mg | INTRAMUSCULAR | Status: DC | PRN

## 2017-05-24 MED ORDER — SOD CITRATE-CITRIC ACID 500-334 MG/5ML PO SOLN
ORAL | Status: AC
  Filled 2017-05-24: qty 15

## 2017-05-24 MED ORDER — MAGNESIUM SULFATE 40 G IN LACTATED RINGERS - SIMPLE
2.0000 g/h | INTRAVENOUS | Status: AC
  Administered 2017-05-24: 2 g/h via INTRAVENOUS
  Filled 2017-05-24 (×2): qty 500

## 2017-05-24 MED ORDER — LACTATED RINGERS IV SOLN
INTRAVENOUS | Status: DC | PRN
  Administered 2017-05-24: 12:00:00 via INTRAVENOUS

## 2017-05-24 MED ORDER — ONDANSETRON HCL 4 MG/2ML IJ SOLN: 4.0000 mg | INTRAMUSCULAR | Status: DC | PRN

## 2017-05-24 MED ORDER — MEPERIDINE HCL 25 MG/ML IJ SOLN: 6.2500 mg | INTRAMUSCULAR | Status: DC | PRN

## 2017-05-24 MED ORDER — LACTATED RINGERS IV SOLN: INTRAVENOUS | Status: DC

## 2017-05-24 MED ORDER — PHENYLEPHRINE 40 MCG/ML (10ML) SYRINGE FOR IV PUSH (FOR BLOOD PRESSURE SUPPORT)
PREFILLED_SYRINGE | INTRAVENOUS | Status: AC
  Filled 2017-05-24: qty 10

## 2017-05-24 MED ORDER — 0.9 % SODIUM CHLORIDE (POUR BTL) OPTIME
TOPICAL | Status: DC | PRN
  Administered 2017-05-24: 1000 mL

## 2017-05-24 MED ORDER — PRENATAL MULTIVITAMIN CH
1.0000 | ORAL_TABLET | Freq: Every day | ORAL | Status: DC
  Administered 2017-05-25: 1 via ORAL
  Filled 2017-05-24: qty 1

## 2017-05-24 MED ORDER — ZOLPIDEM TARTRATE 5 MG PO TABS: 5.0000 mg | ORAL_TABLET | Freq: Every evening | ORAL | Status: DC | PRN

## 2017-05-24 MED ORDER — BENZOCAINE-MENTHOL 20-0.5 % EX AERO
1.0000 "application " | INHALATION_SPRAY | CUTANEOUS | Status: DC | PRN
  Filled 2017-05-24: qty 56

## 2017-05-24 MED ORDER — BUPIVACAINE HCL (PF) 0.5 % IJ SOLN
INTRAMUSCULAR | Status: AC
  Filled 2017-05-24: qty 30

## 2017-05-24 MED ORDER — OXYCODONE-ACETAMINOPHEN 5-325 MG PO TABS
1.0000 | ORAL_TABLET | Freq: Four times a day (QID) | ORAL | Status: DC | PRN
  Administered 2017-05-24 – 2017-05-25 (×2): 1 via ORAL
  Filled 2017-05-24 (×2): qty 1

## 2017-05-24 MED ORDER — SIMETHICONE 80 MG PO CHEW: 80.0000 mg | CHEWABLE_TABLET | ORAL | Status: DC | PRN

## 2017-05-24 MED ORDER — DIPHENHYDRAMINE HCL 25 MG PO CAPS: 25.0000 mg | ORAL_CAPSULE | Freq: Four times a day (QID) | ORAL | Status: DC | PRN

## 2017-05-24 MED ORDER — DIBUCAINE 1 % RE OINT
1.0000 "application " | TOPICAL_OINTMENT | RECTAL | Status: DC | PRN
  Filled 2017-05-24: qty 28

## 2017-05-24 MED ORDER — WITCH HAZEL-GLYCERIN EX PADS: 1.0000 "application " | MEDICATED_PAD | CUTANEOUS | Status: DC | PRN

## 2017-05-24 MED ORDER — BUPIVACAINE HCL (PF) 0.5 % IJ SOLN
INTRAMUSCULAR | Status: DC | PRN
  Administered 2017-05-24: 30 mL

## 2017-05-24 MED ORDER — ACETAMINOPHEN 325 MG PO TABS: 650.0000 mg | ORAL_TABLET | ORAL | Status: DC | PRN

## 2017-05-24 MED ORDER — PROMETHAZINE HCL 25 MG/ML IJ SOLN
6.2500 mg | INTRAMUSCULAR | Status: DC | PRN
Start: 2017-05-24 — End: 2017-05-24

## 2017-05-24 MED ORDER — KETOROLAC TROMETHAMINE 30 MG/ML IJ SOLN
INTRAMUSCULAR | Status: AC
  Filled 2017-05-24: qty 1

## 2017-05-24 MED ORDER — OXYCODONE HCL 5 MG/5ML PO SOLN
5.0000 mg | Freq: Once | ORAL | Status: DC | PRN
Start: 2017-05-24 — End: 2017-05-24

## 2017-05-24 MED ORDER — MIDAZOLAM HCL 2 MG/2ML IJ SOLN
INTRAMUSCULAR | Status: AC
  Filled 2017-05-24: qty 2

## 2017-05-24 MED ORDER — SODIUM BICARBONATE 8.4 % IV SOLN
INTRAVENOUS | Status: DC | PRN
  Administered 2017-05-24 (×4): 5 mL via EPIDURAL

## 2017-05-24 SURGICAL SUPPLY — 23 items
BLADE SURG 11 STRL SS (BLADE) ×3 IMPLANT
CLIP FILSHIE TUBAL LIGA STRL (Clip) ×6 IMPLANT
CLOTH BEACON ORANGE TIMEOUT ST (SAFETY) ×3 IMPLANT
DRSG OPSITE POSTOP 3X4 (GAUZE/BANDAGES/DRESSINGS) ×3 IMPLANT
DURAPREP 26ML APPLICATOR (WOUND CARE) ×3 IMPLANT
ELECT REM PT RETURN 9FT ADLT (ELECTROSURGICAL) ×3
ELECTRODE REM PT RTRN 9FT ADLT (ELECTROSURGICAL) ×1 IMPLANT
GLOVE BIOGEL PI IND STRL 7.0 (GLOVE) ×3 IMPLANT
GLOVE BIOGEL PI INDICATOR 7.0 (GLOVE) ×6
GLOVE ECLIPSE 7.0 STRL STRAW (GLOVE) ×3 IMPLANT
GOWN STRL REUS W/TWL LRG LVL3 (GOWN DISPOSABLE) ×6 IMPLANT
NEEDLE HYPO 22GX1.5 SAFETY (NEEDLE) ×3 IMPLANT
NS IRRIG 1000ML POUR BTL (IV SOLUTION) ×3 IMPLANT
PACK ABDOMINAL MINOR (CUSTOM PROCEDURE TRAY) ×3 IMPLANT
PENCIL BUTTON HOLSTER BLD 10FT (ELECTRODE) ×3 IMPLANT
PROTECTOR NERVE ULNAR (MISCELLANEOUS) ×3 IMPLANT
SPONGE LAP 4X18 X RAY DECT (DISPOSABLE) IMPLANT
SUT VIC AB 0 CT1 27 (SUTURE) ×2
SUT VIC AB 0 CT1 27XBRD ANBCTR (SUTURE) ×1 IMPLANT
SUT VICRYL 4-0 PS2 18IN ABS (SUTURE) ×3 IMPLANT
SYR CONTROL 10ML LL (SYRINGE) ×3 IMPLANT
TOWEL OR 17X24 6PK STRL BLUE (TOWEL DISPOSABLE) ×6 IMPLANT
TRAY FOLEY CATH SILVER 14FR (SET/KITS/TRAYS/PACK) ×3 IMPLANT

## 2017-05-24 NOTE — Lactation Note (Signed)
This note was copied from a baby's chart. Lactation Consultation Note Mom tried to BF her 1st child but wouldn't latch. He is now 38 yrs old. Twins are 53 2/[redacted] weeks gestation, weights under 5 lbs each.  Mom BF both at the same time after delivery. Encouraged mom to BF one at a time to be able to focus on each baby one at a time. Alternate breast explained. Hand expression taught. No colostrum noted.  Mom shown how to use DEBP & how to disassemble, clean, & reassemble parts. Mom knows to pump q3h for 15-20 min.  Mom encouraged to feed baby 8-12 times/24 hours and with feeding cues.  LPI information sheet given and reviewed. Discussed BF, not over 30 min. supplementing, hand expressio, pumping, positioning, I&O, supply and demand. Alimentum given. MD can order higher calorie formula if needed.  Mom has very LARGE pendulum breast w/semi flat nipple. Everts w/stimulation only for short time. Nipple at the bottom end of breast. Areola and nipple tissue thick, ? Edema. Mom has generalized edema. Fitted #24 NS. Not able to assess if small babies are able to take the #24 nipple shield. Or effectively BF on nipple d/t size of nipple and small mouths. Encouraged to call for latch assistance.  Mom knows to supplement after BF, and strict I&O.  Reported to RN.  WH/LC brochure given w/resources, support groups and LC services.  Patient Name: Emily Wilkinson ZOXWR'UToday's Date: 05/24/2017 Reason for consult: Initial assessment;Multiple gestation;Late-preterm 34-36.6wks;Infant < 6lbs   Maternal Data Has patient been taught Hand Expression?: Yes Does the patient have breastfeeding experience prior to this delivery?: No  Feeding Feeding Type: Formula  LATCH Score       Type of Nipple: Everted at rest and after stimulation  Comfort (Breast/Nipple): Soft / non-tender        Interventions Interventions: Breast feeding basics reviewed;Support pillows;Position options;Breast massage;Hand  express;DEBP;Hand pump;Breast compression;Reverse pressure  Lactation Tools Discussed/Used Tools: Pump;Nipple Shields Nipple shield size: 24 Breast pump type: Double-Electric Breast Pump Pump Review: Setup, frequency, and cleaning;Milk Storage Initiated by:: Peri JeffersonL. Valaria Kohut RN IBCLC Date initiated:: 05/24/17   Consult Status Consult Status: Follow-up Date: 05/24/17 Follow-up type: In-patient    Charyl DancerCARVER, [name] 05/24/2017, 7:41 AM

## 2017-05-24 NOTE — Op Note (Signed)
Emily SpareShalonda Romas 05/22/2017 - 05/24/2017  PREOPERATIVE DIAGNOSES: Multiparity, undesired fertility  POSTOPERATIVE DIAGNOSES: Multiparity, undesired fertility  PROCEDURE:  Postpartum Bilateral Tubal Sterilization using Filshie Clips   SURGEON: Dr.  [name] Shavone Nevers  ANESTHESIA:  Epidural and local analgesia using 30 ml of 0.5% Marcaine  COMPLICATIONS:  None immediate.  ESTIMATED BLOOD LOSS: 5 ml.  FLUIDS: 1000 ml LR.  INDICATIONS:  36 y.o. [id number] with undesired fertility,status post vaginal delivery, desires permanent sterilization.  Other reversible forms of contraception were discussed with patient; she declines all other modalities. Risks of procedure discussed with patient including but not limited to: risk of regret, permanence of method, bleeding, infection, injury to surrounding organs and need for additional procedures.  Failure risk of 1 -2 % with increased risk of ectopic gestation if pregnancy occurs was also discussed with patient.      FINDINGS:  Normal uterus, tubes, and ovaries.  4 cm exophytic/subserosal fibroid noted on right upper fundus area adjacent to cornual region.  PROCEDURE DETAILS: The patient was taken to the operating room where her epidural anesthesia was dosed up to surgical level and found to be adequate.  She was then placed in the dorsal supine position and prepped and draped in sterile fashion.  After an adequate timeout was performed, attention was turned to the patient's abdomen where a small transverse skin incision was made under the umbilical fold. The incision was taken down to the layer of fascia using the scalpel, and fascia was incised, and extended bilaterally using Mayo scissors. The peritoneum was entered in a sharp fashion. Attention was then turned to the patient's uterus, and left fallopian tube was identified and followed out to the fimbriated end.  A Filshie clip was placed on the left fallopian tube about 3 cm from the cornual attachment, with care  given to incorporate the underlying mesosalpinx.  A similar process was carried out on the right side allowing for bilateral tubal sterilization.  Good hemostasis was noted overall.  Local analgesia was injected into both Filshie application sites.The instruments were then removed from the patient's abdomen and the fascial incision was repaired with 0 Vicryl, and the skin was closed with a 4-0 Vicryl subcuticular stitch. The patient tolerated the procedure well.  Instrument, sponge, and needle counts were correct times two.  The patient was then taken to the recovery room awake and in stable condition.   [name]  Aneesh Faller, MD, FACOG Attending Obstetrician & Gynecologist Faculty Practice, [facility] [place]

## 2017-05-24 NOTE — Anesthesia Postprocedure Evaluation (Signed)
Anesthesia Post Note  Patient: Viviann SpareShalonda Munley  Procedure(s) Performed: Procedure(s) (LRB): POST PARTUM TUBAL LIGATION (Bilateral)     Patient location during evaluation: [facility] Anesthesia Type: Epidural Level of consciousness: awake and alert and oriented Pain management: pain level controlled Vital Signs Assessment: post-procedure vital signs reviewed and stable Respiratory status: spontaneous breathing and nonlabored ventilation Cardiovascular status: stable Postop Assessment: no headache, patient able to bend at knees, no backache, no signs of nausea or vomiting, epidural receding and adequate PO intake Anesthetic complications: no    Last Vitals:  Vitals:   05/24/17 1341 05/24/17 1453  BP: 117/81 140/77  Pulse: 83 85  Resp: 18 18  Temp: 36.7 C 36.7 C  SpO2: 100% 100%    Last Pain:  Vitals:   05/24/17 1539  TempSrc:   PainSc: 0-No pain   Pain Goal: Patients Stated Pain Goal: 7 (05/24/17 16100633)               Laban EmperorMalinova,Jerome Otter Hristova

## 2017-05-24 NOTE — Progress Notes (Signed)
Twin B FHT with late decels. Pitocin turned off. SVE 8cm per RN exam.  Into room to evaluate. Dr. Despina HiddenEure also in to evaluate.  BP noted to be 116/59 from 150s/90s prior to epidural. Phenylephrine given. Decels improved. FHT 170s, moderate variability.  Continue to monitor closely.   Raynelle FanningJulie P. Degele, MD OB Fellow

## 2017-05-24 NOTE — Anesthesia Postprocedure Evaluation (Signed)
Anesthesia Post Note  Patient: Viviann SpareShalonda Parham  Procedure(s) Performed: * No procedures listed *     Patient location during evaluation: Mother Baby Anesthesia Type: Epidural Level of consciousness: awake and alert and oriented Pain management: satisfactory to patient Vital Signs Assessment: post-procedure vital signs reviewed and stable Respiratory status: spontaneous breathing and nonlabored ventilation Cardiovascular status: stable Postop Assessment: no headache, no backache, no signs of nausea or vomiting, adequate PO intake and patient able to bend at knees (patient up walking) Anesthetic complications: no    Last Vitals:  Vitals:   05/24/17 0443 05/24/17 0803  BP: 135/71 131/78  Pulse: 87 83  Resp: 18 18  Temp: 37 C 36.9 C  SpO2: 98% 100%    Last Pain:  Vitals:   05/24/17 0803  TempSrc: Oral  PainSc:    Pain Goal: Patients Stated Pain Goal: 7 (05/24/17 16100633)               Madison HickmanGREGORY,Skyeler Scalese

## 2017-05-24 NOTE — OR Nursing (Signed)
Sent for patient. Awaiting arrival at OR desk.

## 2017-05-24 NOTE — Lactation Note (Signed)
This note was copied from a baby's chart. Lactation Consultation Note  Patient Name: Emily MarketGirlA [name] ZOXWR'UToday's Date: Wilkinson Reason for consult: Follow-up assessment;Late-preterm 34-36.6wks (both less than 5 pounds)   I reviewed hand expression with mom, and she has tiny drops of colostrum, Mom having trouble with hand expression, due to large,breasts and tough areola tissue. I advised mom to limit BF attempts to 15 minutes, and then bottle feed as per LPI protocol, no longer to use syringe, since baby's are so small, and early term. I showed parents how to side lie the baby's for safe bottle feeding. I also told mom to do skin to skin  with her babies, and to pump after breat attempt, skin to skin, and bottle feeding, every 3 hours around the clock.  Mom going for a tubal ligation today, so I told her to do the best she could with pumping, but her care comes first.    Maternal Data    Feeding    LATCH Score                   Interventions Interventions: DEBP;Expressed milk;Hand express  Lactation Tools Discussed/Used Nipple shield size:  (27)   Consult Status Consult Status: Follow-up Date: 05/25/17 Follow-up type: In-patient    Alfred LevinsLee, Giara Mcgaughey [name], 9:43 AM

## 2017-05-24 NOTE — Transfer of Care (Signed)
Immediate Anesthesia Transfer of Care Note  Patient: Emily Wilkinson  Procedure(s) Performed: Procedure(s): POST PARTUM TUBAL LIGATION (Bilateral)  Patient Location: PACU  Anesthesia Type:Epidural  Level of Consciousness: awake, alert  and oriented  Airway & Oxygen Therapy: Patient Spontanous Breathing  Post-op Assessment: Report given to RN and Post -op Vital signs reviewed and stable  Post vital signs: Reviewed and stable  Last Vitals:  Vitals:   05/24/17 0443 05/24/17 0803  BP: 135/71 131/78  Pulse: 87 83  Resp: 18 18  Temp: 37 C 36.9 C  SpO2: 98% 100%    Last Pain:  Vitals:   05/24/17 0803  TempSrc: Oral  PainSc:       Patients Stated Pain Goal: 7 (05/24/17 16100633)  Complications: No apparent anesthesia complications

## 2017-05-24 NOTE — Anesthesia Preprocedure Evaluation (Addendum)
Anesthesia Evaluation  Patient identified by MRN, date of birth, ID band Patient awake    Reviewed: Allergy & Precautions, H&P , NPO status , Patient's Chart, lab work & pertinent test results, reviewed documented beta blocker date and time   Airway Mallampati: I  TM Distance: >3 FB Neck ROM: full    Dental no notable dental hx.    Pulmonary neg pulmonary ROS,    Pulmonary exam normal breath sounds clear to auscultation       Cardiovascular hypertension, On Medications Normal cardiovascular exam Rhythm:regular Rate:Normal     Neuro/Psych negative neurological ROS  negative psych ROS   GI/Hepatic negative GI ROS, Neg liver ROS,   Endo/Other  negative endocrine ROS  Renal/GU negative Renal ROS  negative genitourinary   Musculoskeletal   Abdominal   Peds  Hematology negative hematology ROS (+)   Anesthesia Other Findings   Reproductive/Obstetrics                             Anesthesia Physical  Anesthesia Plan  ASA: III  Anesthesia Plan: Epidural   Post-op Pain Management:    Induction:   PONV Risk Score and Plan: 3 and Ondansetron, Dexamethasone, Midazolam and Scopolamine patch - Pre-op  Airway Management Planned:   Additional Equipment:   Intra-op Plan:   Post-operative Plan:   Informed Consent: I have reviewed the patients History and Physical, chart, labs and discussed the procedure including the risks, benefits and alternatives for the proposed anesthesia with the patient or authorized representative who has indicated his/her understanding and acceptance.   Dental advisory given  Plan Discussed with: CRNA  Anesthesia Plan Comments:        Anesthesia Quick Evaluation

## 2017-05-24 NOTE — Anesthesia Postprocedure Evaluation (Signed)
Anesthesia Post Note  Patient: Emily Wilkinson  Procedure(s) Performed: * No procedures listed *     Patient location during evaluation: Mother Baby Anesthesia Type: Epidural Level of consciousness: awake and alert Pain management: pain level controlled Vital Signs Assessment: post-procedure vital signs reviewed and stable Respiratory status: spontaneous breathing, nonlabored ventilation and respiratory function stable Cardiovascular status: stable Postop Assessment: no headache, no backache and epidural receding Anesthetic complications: no    Last Vitals:  Vitals:   05/24/17 0145 05/24/17 0200  BP: 115/70 129/63  Pulse: 100 96  Resp: 19 18  Temp:    SpO2:      Last Pain:  Vitals:   05/24/17 0200  TempSrc:   PainSc: 0-No pain   Pain Goal: Patients Stated Pain Goal: 7 (05/23/17 0742)               Sherle Mello

## 2017-05-24 NOTE — Plan of Care (Signed)
Problem: Education: Goal: Knowledge of disease or condition will improve Outcome: Progressing Patient is aware of diagnosis and plan of care.Verbalized understanding of signs and symptoms of preeclampsia. Goal: Knowledge of the prescribed therapeutic regimen will improve Outcome: Completed/Met Date Met: 05/24/17 Plan of care discussed with patient and husband.  Problem: Fluid Volume: Goal: Peripheral tissue perfusion will improve Outcome: Completed/Met Date Met: 05/24/17 VSS at this time.

## 2017-05-24 NOTE — Progress Notes (Signed)
Faculty Practice OB/GYN Attending Note   36 y.o. [phone]  PPD#0 s/p SVD of mono-di twins at 6884w2d after IOL for severe preeclampsia, desiring permanent sterilization. Other reversible forms of contraception including vasectomy were discussed with patient; she declines all other modalities. Risks of procedure discussed with patient including but not limited to: risk of regret, permanence of method, bleeding, infection, injury to surrounding organs and need for additional procedures.  Failure risk of 1-2 % with increased risk of ectopic gestation if pregnancy occurs was also discussed with patient.  Patient verbalized understanding of these risks and wants to proceed with sterilization.  Written informed consent obtained.  To OR when ready.     [name]  Emily Deandrade, MD, FACOG Attending Obstetrician & Gynecologist Faculty Practice, [facility] [place] [place]

## 2017-05-24 NOTE — Addendum Note (Signed)
Addendum  created 05/24/17 0819 by Shanon PayorGregory, Kenyon Eichelberger M, CRNA   Charge Capture section accepted, Sign clinical note, Visit diagnoses modified

## 2017-05-24 NOTE — Consult Note (Signed)
Neonatology Note:   Attendance at Delivery:    I was asked by Dr. Despina HiddenEure to attend this vaginal delivery at 35 2/7 weeks due to mono/di twins. The mother is a G2P1 O pos, GBS unknown with twin gestation, chronic HTN, and elevated glucola with normal 3 hour GTT. ROM 4 hours prior to delivery, fluid clear. Mother was treated with Betamethasone 8/31-9/1, magnesium sulfate bolus, and Pen G > 4 hours prior to delivery, and she was afebrile during labor.  Baby A had polyhydramnios, vertex presentation. She was vigorous with good spontaneous cry and tone. Delayed cord clamping was done. Needed only minimal bulb suctioning. O2 saturations were a little low at 5 minutes (78-80%), so BBO2 was given briefly, after which she maintained saturations in the mid- to high 90s. Lungs clear to ausc in DR. Ap 9/9.   Baby B was known to have a small VSD and small pericardial effusion on fetal echo, cleared by cardiology per Dr. Despina HiddenEure. Infant was in breech presentation; turned in utero, ROM just prior to delivery, delivered vertex. Infant was pale, floppy, HR about 100, and with minimal respiratory effort. We bulb suctioned and gave stimulation, then applied PPV. She was apneic, but HR rose to the 130s and O2 saturations gradually rose. Required PPV for about 3 minutes, then got CPAP via neopuff for another 2-3 minutes, weaning the FIO2 down to 21%, then weaned to room air at about 10-11 minutes. We observed her for several more minutes and she maintained O2 sats in the 98-100% range in room air, with good air exchange. We performed DeLee suction to the stomach because she looked a little full (likely due to PPV), normal after suctioning out air. Ap 4/8/9. Lungs clear, perfusion excellent, pink. Weight approximately 2150 grams per bed scale. Infant is smaller than twin.  I spoke with the parents in the DR about the risks of hypoglycemia and hypothermia, especially for Twin B. At this time, the babies may remain with mother on MBU  status. I recommended PC with Neosure-22 formula, at least for first 2-3 feedings, to help avoid hypoglycemia. Spoke with the nurse who will be assuming their care in the LDR.   Doretha Souhristie C. Haidynn Almendarez, MD

## 2017-05-24 NOTE — Plan of Care (Signed)
Problem: Education: Goal: Knowledge of condition will improve Outcome: Completed/Met Date Met: 05/24/17 Plan of care,medications,and baby plan of care discussed with patient and husband.   Problem: Coping: Goal: Ability to cope will improve Outcome: Completed/Met Date Met: 05/24/17 Patient has good interaction with babies.  Problem: Life Cycle: Goal: Risk for postpartum hemorrhage will decrease Outcome: Completed/Met Date Met: 05/24/17 Vaginal bleeding is minimal at this time.  Problem: Respiratory: Goal: Ability to maintain adequate ventilation will improve Outcome: Completed/Met Date Met: 05/24/17 Lungs are clear bilaterally.O2 SAT's are WNL.

## 2017-05-25 ENCOUNTER — Ambulatory Visit: Payer: Self-pay

## 2017-05-25 MED ORDER — OXYCODONE-ACETAMINOPHEN 5-325 MG PO TABS: 1.0000 | ORAL_TABLET | Freq: Four times a day (QID) | ORAL | 0 refills | Status: AC | PRN

## 2017-05-25 MED ORDER — IBUPROFEN 600 MG PO TABS: 600.0000 mg | ORAL_TABLET | Freq: Four times a day (QID) | ORAL | 0 refills | Status: AC

## 2017-05-25 NOTE — Lactation Note (Signed)
This note was copied from a baby's chart. Lactation Consultation Note  Patient Name: Emily Wilkinson ZOXWR'UToday's Date: 05/25/2017 Reason for consult: Follow-up assessment;Multiple gestation;Late-preterm 34-36.6wks;Infant < 6lbs   Follow-up consult at 40 hrs old for LPTI twins; GA 35.2; BW <5 lbs.  Mom is P3.   Mom reports she is putting infants to breast with each latch for 10 minutes prior to supplementation with formula using LPTI Green Sheet. Encouraged mom to write breastfeedings on feeding log so RN can put into computer.    Infant A has Formula via bottle x8 (10-15 ml); voids-7; stools-4; no recorded breast in past 24 hrs but mom states infant is going to breast.  Infant B has Formula via bottle x10 (10-15 ml); voids-6; stools-6; no recorded breast in past 24 hrs but mom states infant is going to breast.   Mom reports pumping only 3 times today.  LC encouraged increasing pumping to goal of 8 times per day after breastfeeding infants. Reviewed pumping on "initiate" setting with teach-back.  Mom reports turning dial up to 4-5 drops with pumping. Encouraged using hands-on pumping with hand expression at the end of the pumping session. Encouraged mom to call for assistance as needed with latching.         Consult Status Consult Status: Follow-up Date: 05/26/17 Follow-up type: In-patient    Lendon KaVann, Roe Wilner Walker 05/25/2017, 5:19 PM

## 2017-05-25 NOTE — Progress Notes (Signed)
Discharge teaching complete with pt. Pt understood all information and did not have any questions. Pt discharged home, babies will be made baby pt's.

## 2017-05-25 NOTE — Discharge Summary (Signed)
Physician Discharge Summary  Patient ID: Emily Wilkinson MRN: [id number] DOB/AGE: [birth year] 36 y.o.  Admit date: 05/22/2017 Discharge date: 05/25/2017  Admission Diagnoses: Mono di twins with pre eclampsia at [redacted] weeks gestation  Discharge Diagnoses:  Principal Problem:   Hypertension in pregnancy, preeclampsia, severe, antepartum Active Problems:   Encounter for induction of labor   Monochorionic diamniotic twin gestation in third trimester   AMA (advanced maternal age) multigravida 35+, third trimester   Discharged Condition: stable  Hospital Course: induction unremarkable ECV of twin B, vaginal deliveries of both twins Postpartum course unremarkable  Consults: None  Significant Diagnostic Studies: labs:   Treatments: induction with vaginal delivery, postpartum tubal ligation  Discharge Exam: Blood pressure 132/77, pulse 71, temperature 98.6 F (37 C), temperature source Oral, resp. rate 16, height 5\' 3"  (1.6 m), weight 218 lb 6 oz (99.1 kg), last menstrual period 09/19/2016, SpO2 97 %, unknown if currently breastfeeding. General appearance: alert, cooperative and no distress GI: soft, non-tender; bowel sounds normal; no masses,  no organomegaly  Incision clean dry intact  Disposition: Final discharge disposition not confirmed  Discharge Instructions    Activity as tolerated    Complete by:  As directed    Call MD for:  persistant nausea and vomiting    Complete by:  As directed    Call MD for:  severe uncontrolled pain    Complete by:  As directed    Call MD for:  temperature >100.4    Complete by:  As directed    Diet - low sodium heart healthy    Complete by:  As directed    Remove dressing in 24 hours    Complete by:  As directed    Sexual acrtivity    Complete by:  As directed    You gotta be kidding(none for 6 weeks)     Allergies as of 05/25/2017      Reactions   Sulfa Antibiotics Itching, Rash      Medication List    STOP taking these medications    aspirin EC 81 MG tablet     TAKE these medications   acetaminophen 500 MG tablet Commonly known as:  TYLENOL Take 1,000 mg by mouth every 6 (six) hours as needed for moderate pain.   ibuprofen 600 MG tablet Commonly known as:  ADVIL,MOTRIN Take 1 tablet (600 mg total) by mouth every 6 (six) hours.   oxyCODONE-acetaminophen 5-325 MG tablet Commonly known as:  ROXICET Take 1 tablet by mouth every 6 (six) hours as needed for severe pain.   PRENATAL VITAMIN PO Take 1 tablet by mouth daily.            Discharge Care Instructions        Start     Ordered   05/25/17 0000  ibuprofen (ADVIL,MOTRIN) 600 MG tablet  Every 6 hours     05/25/17 1021   05/25/17 0000  Diet - low sodium heart healthy     05/25/17 1021   05/25/17 0000  Call MD for:  persistant nausea and vomiting     05/25/17 1021   05/25/17 0000  Call MD for:  temperature >100.4     05/25/17 1021   05/25/17 0000  Call MD for:  severe uncontrolled pain     05/25/17 1021   05/25/17 0000  Activity as tolerated     05/25/17 1021   05/25/17 0000  Sexual acrtivity    Comments:  You gotta be kidding(none for 6 weeks)  05/25/17 1021   05/25/17 0000  Remove dressing in 24 hours     05/25/17 1021   05/25/17 0000  oxyCODONE-acetaminophen (ROXICET) 5-325 MG tablet  Every 6 hours PRN     05/25/17 1021   05/22/17 0000  OB RESULTS CONSOLE GC/Chlamydia    Comments:  This external order was created through the Results Console.   05/22/17 2235   05/22/17 0000  OB RESULTS CONSOLE RPR    Comments:  This external order was created through the Results Console.    05/22/17 2235   05/22/17 0000  OB RESULTS CONSOLE HIV antibody    Comments:  This external order was created through the Results Console.    05/22/17 2235   05/22/17 0000  OB RESULTS CONSOLE Rubella Antibody    Comments:  This external order was created through the Results Console.    05/22/17 2235   05/22/17 0000  OB RESULTS CONSOLE Hepatitis B surface antigen     Comments:  This external order was created through the Results Console.    05/22/17 2235   05/22/17 0000  OB RESULTS CONSOLE ABO/Rh    Comments:  This external order was created through the Results Console.    05/22/17 2235   05/22/17 0000  OB RESULTS CONSOLE Hemoglobin and hematocrit, blood    Comments:  This external order was created through the Results Console.    05/22/17 2235   05/22/17 0000  OB RESULTS CONSOLE PLATELET COUNT    Comments:  This external order was created through the Results Console.    05/22/17 2235   05/22/17 0000  OB RESULTS CONSOLE RPR    Comments:  This external order was created through the Results Console.    05/22/17 2236   05/22/17 0000  OB RESULTS CONSOLE HIV antibody    Comments:  This external order was created through the Results Console.    05/22/17 2236   05/22/17 0000  OB RESULTS CONSOLE ABO/Rh    Comments:  This external order was created through the Results Console.    05/22/17 2304     Follow-up Information    Lazaro ArmsEure, Luther H, MD Follow up in 6 week(s).   Specialties:  Obstetrics and Gynecology, Radiology Why:  pp visit Contact information: [address] [place] [address] [phone]           Signed: Lazaro ArmsURE,LUTHER H 05/25/2017, 10:22 AM

## 2017-05-25 NOTE — Discharge Instructions (Signed)

## 2017-05-27 ENCOUNTER — Encounter (HOSPITAL_COMMUNITY): Payer: Self-pay | Admitting: Obstetrics & Gynecology

## 2017-12-21 IMAGING — US US MFM OB LIMITED
1 series · 15 of 28 positions shown · non-contrast
Comparison: none

[Series 1: us mfm ob limited · 30 acquisitions, 15 frames shown]
[im 1/30]
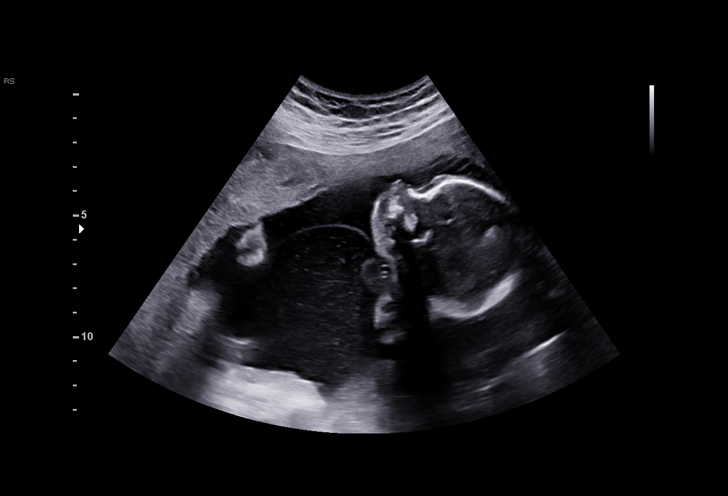
[im 3/30]
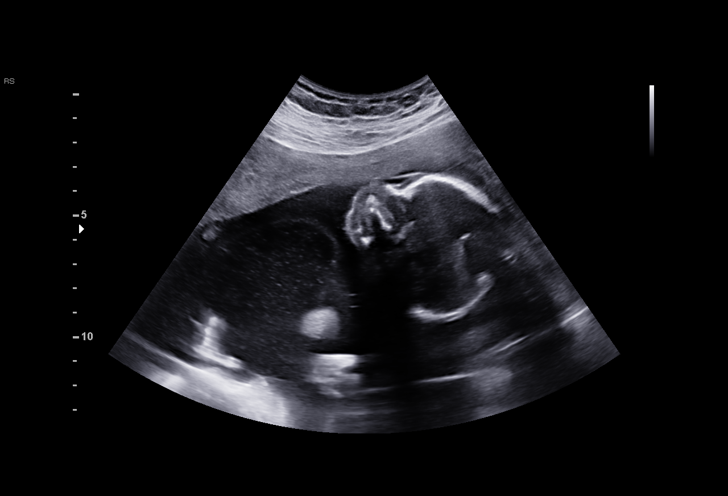
[im 5/30]
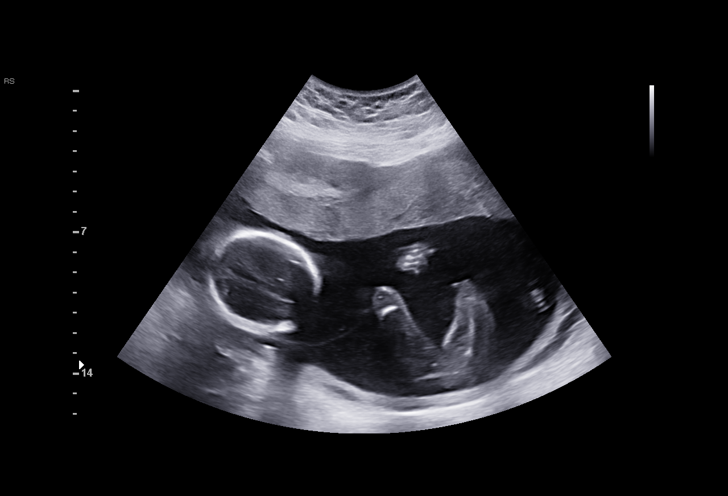
[im 7/30]
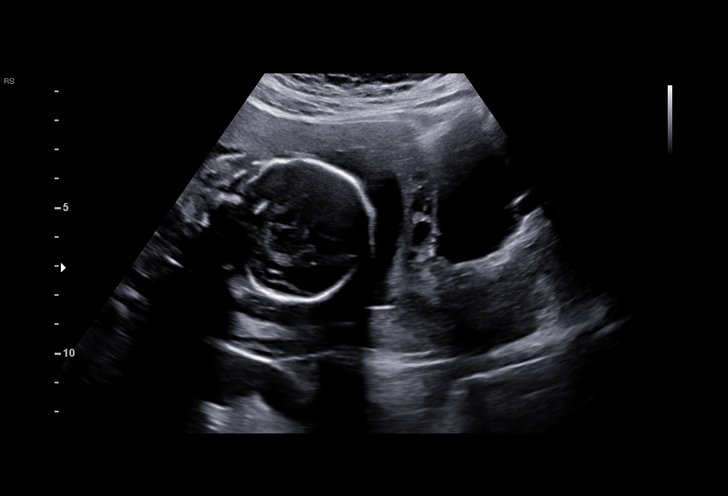
[im 9/30]
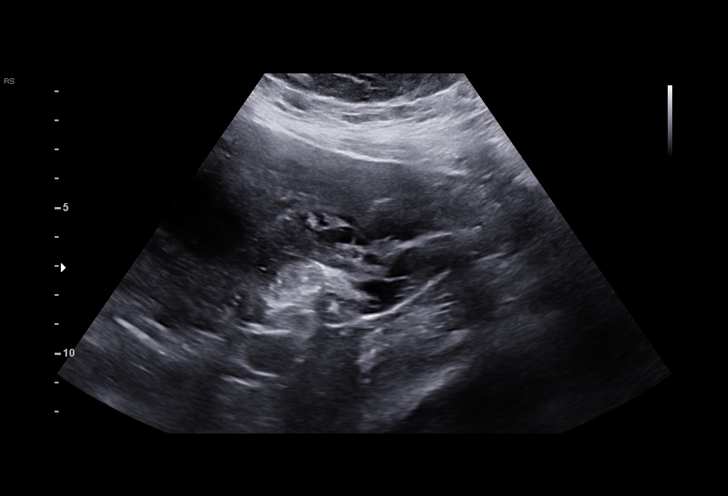
[im 11/30]
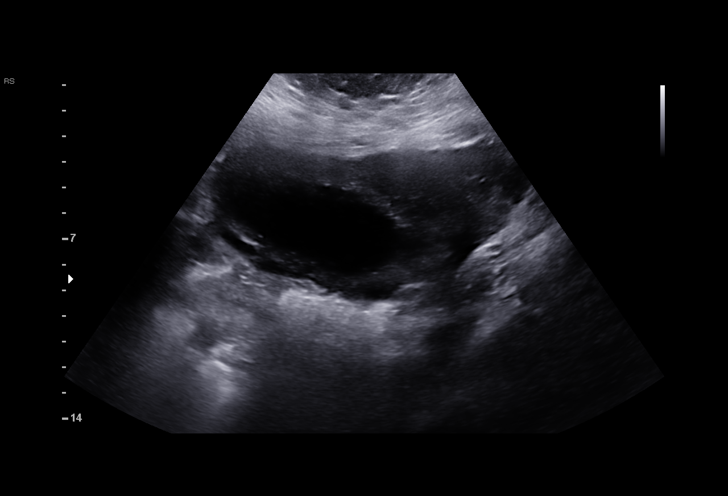
[im 13/30]
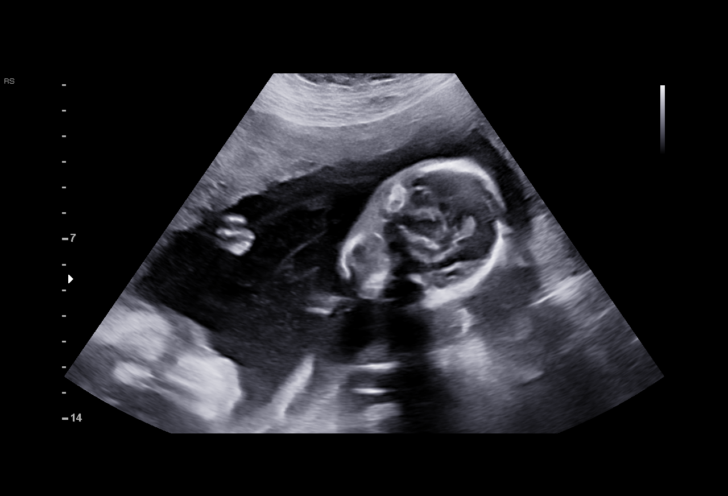
[im 16/30]
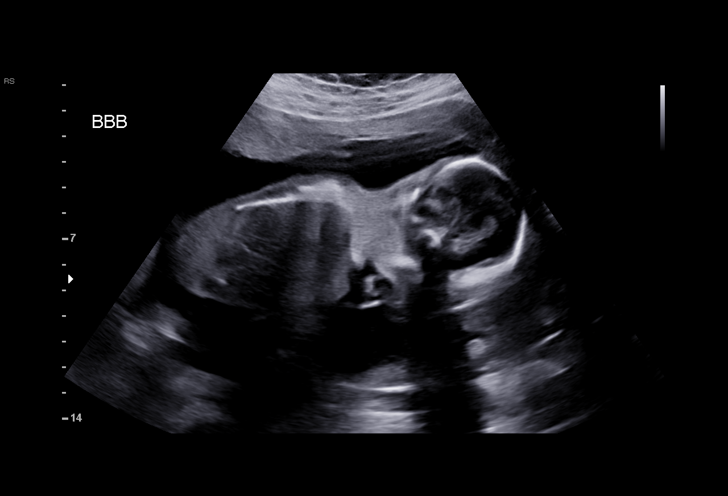
[im 17/30]
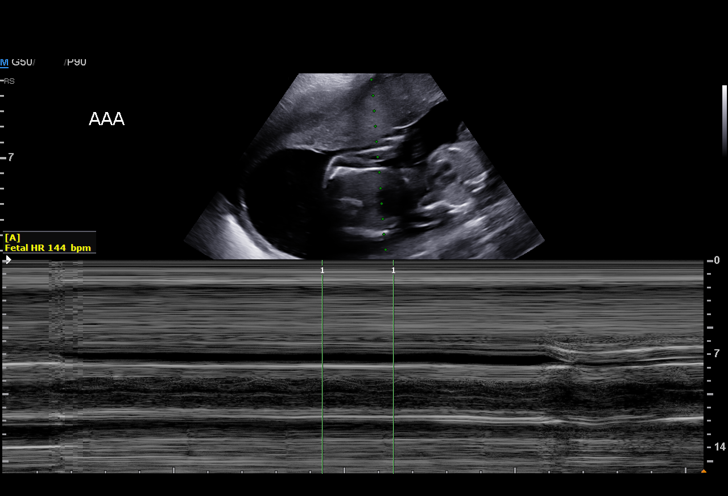
[im 19/30]
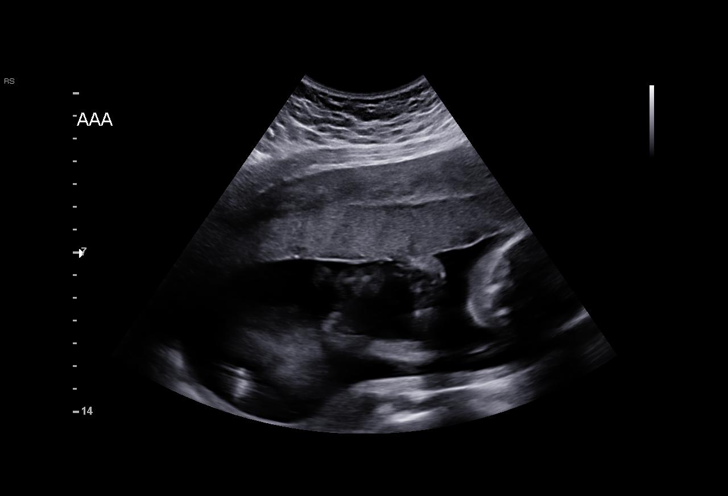
[im 21/30]
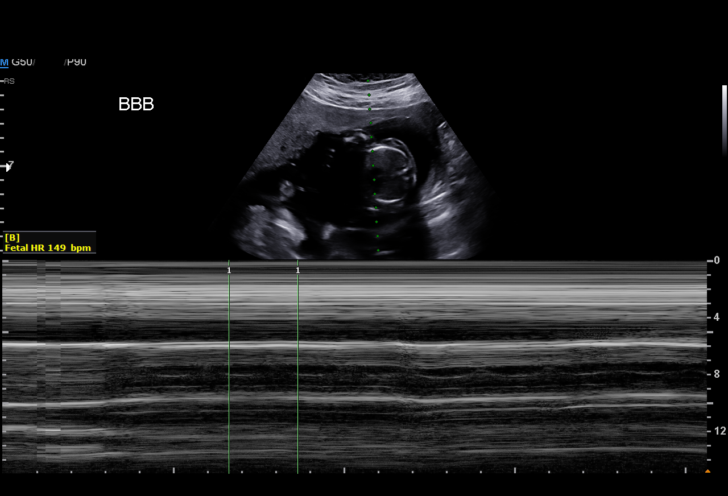
[im 23/30]
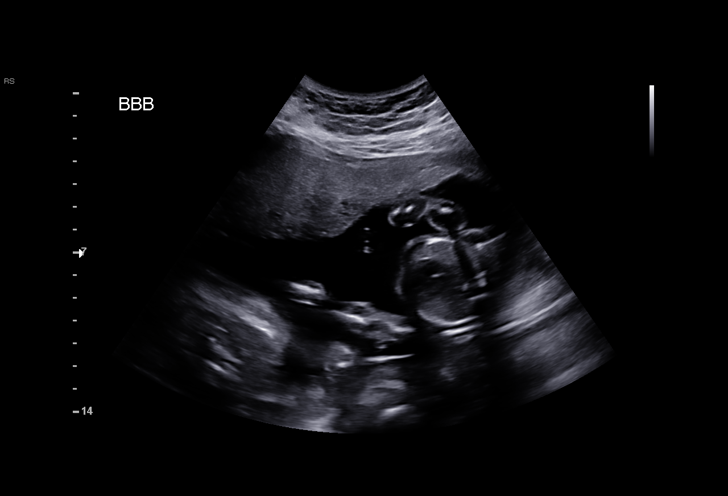
[im 25/30]
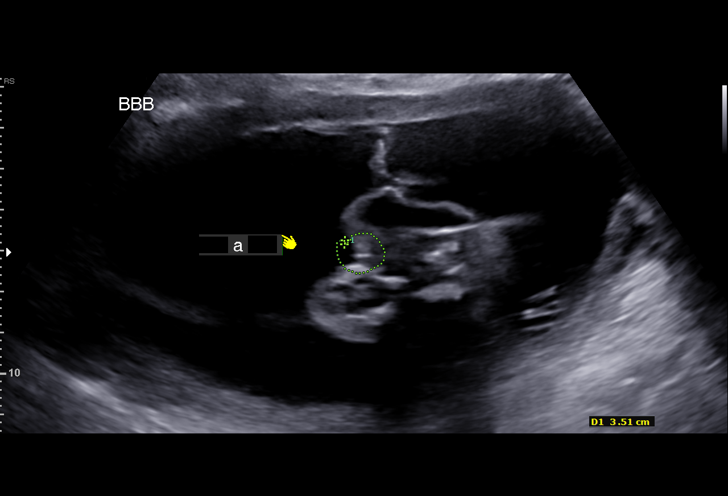
[im 27/30]
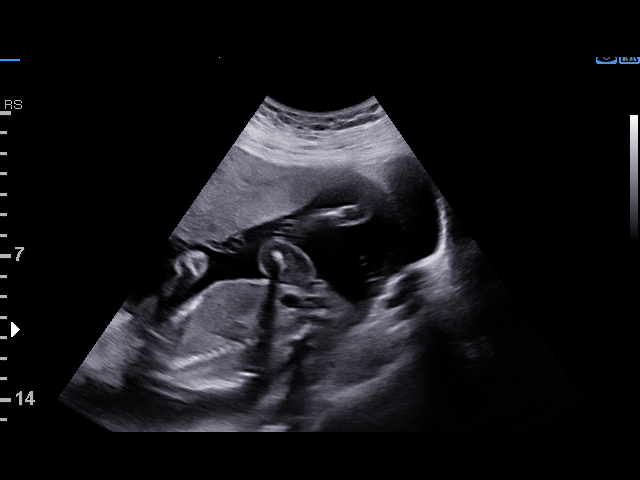
[im 30/30]
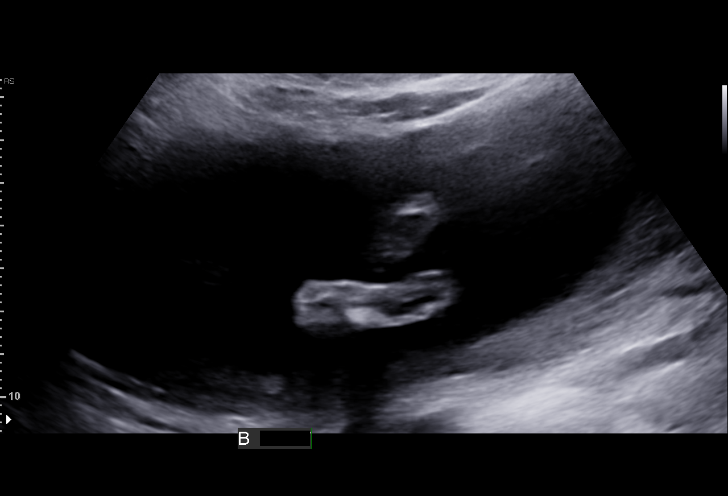

[15 of 28 positions shown; findings below may reference images not displayed]

[REDACTED].

1  HAMIDATU URANW            [id number]      [id number]     [id number]
Indications

20 weeks gestation of pregnancy
Twin pregnancy, Janjua/Laborda, second trimester
Hypertension - Chronic/Pre-existing; no meds
Advanced maternal age multigravida 35+,
second trimester
OB History

Gravidity:    2         Term:   1
Living:       1
Fetal Evaluation (Fetus A)

Num Of Fetuses:     2
Fetal Heart         144
Rate(bpm):
Cardiac Activity:   Observed
Fetal Lie:          Maternal right side
Presentation:       Breech
Placenta:           Anterior, above cervical os
P. Cord Insertion:  Previously Visualized
Membrane Desc:      Dividing Membrane seen

Amniotic Fluid
AFI FV:      Subjectively within normal limits

Largest Pocket(cm)
6.74
Gestational Age (Fetus A)

LMP:           20w 4d       Date:   09/19/16                 EDD:   06/26/17
Best:          20w 4d    Det. By:   LMP  (09/19/16)          EDD:   06/26/17
Fetal Evaluation (Fetus B)

Num Of Fetuses:     2
Fetal Heart         149
Rate(bpm):
Cardiac Activity:   Observed
Fetal Lie:          Maternal left side
Presentation:       Cephalic
Placenta:           Anterior, above cervical os
P. Cord Insertion:  Velamentous insertion,prev vis
Membrane Desc:      Dividing Membrane seen

Amniotic Fluid
AFI FV:      Subjectively within normal limits

Largest Pocket(cm)
5.42
Gestational Age (Fetus B)

LMP:           20w 4d       Date:   09/19/16                 EDD:   06/26/17
Best:          20w 4d    Det. By:   LMP  (09/19/16)          EDD:   06/26/17
Cervix Uterus Adnexa

Cervix
Length:              3  cm.
Normal appearance by transabdominal scan.

Uterus
No abnormality visualized.

Left Ovary
Not visualized.

Right Ovary
Not visualized.

Adnexa:       No abnormality visualized. No adnexal mass
visualized.
Impression

Monochorionic/diamniotic twin pregnancy at 20+4 weeks,
here for TTTS surveillance
Normal amniotic fluid volume x 2
No evidence of TTTS

Recommendations

Repeat scan in 2 weeks with growth evaluation

## 2018-02-04 IMAGING — US US MFM OB FOLLOW-UP EACH ADDL GEST (MODIFY)
1 series · 12 of 28 positions shown · non-contrast
Comparison: none

[Series 1: us mfm ob follow-up each addl gest (modify) · 55 acquisitions, 12 frames shown]
[im 3/55]
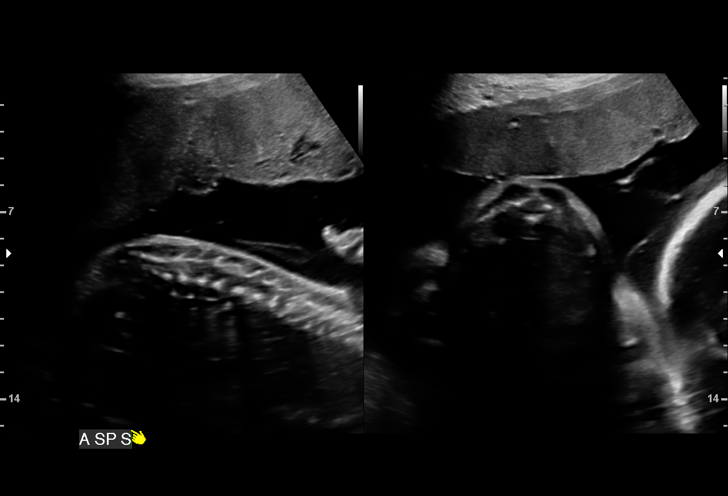
[im 7/55]
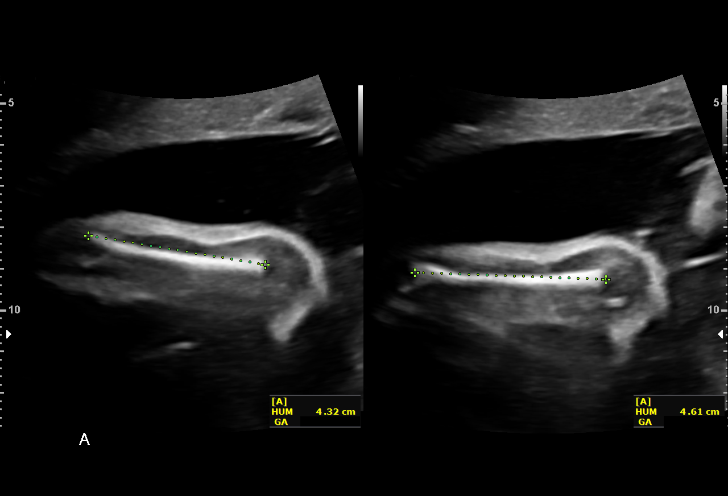
[im 11/55]
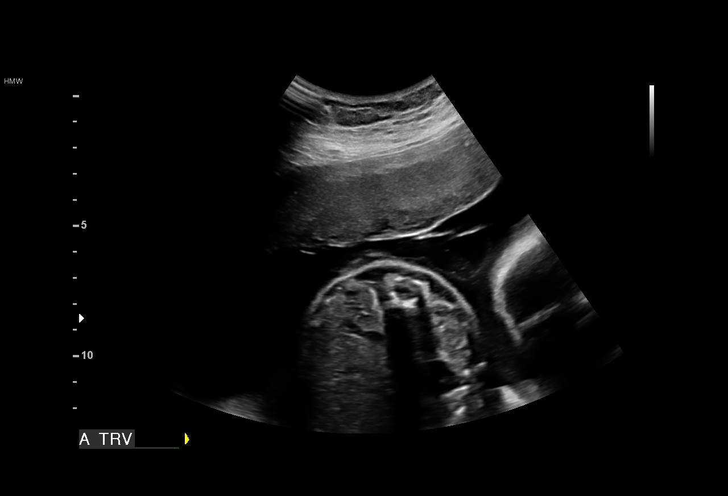
[im 17/55]
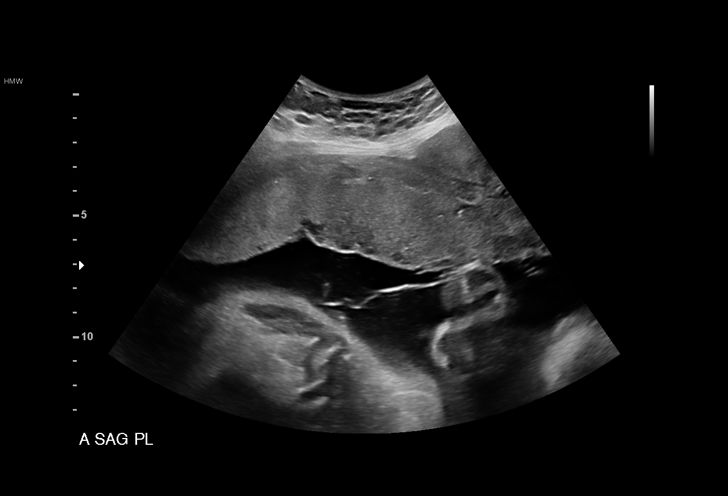
[im 21/55]
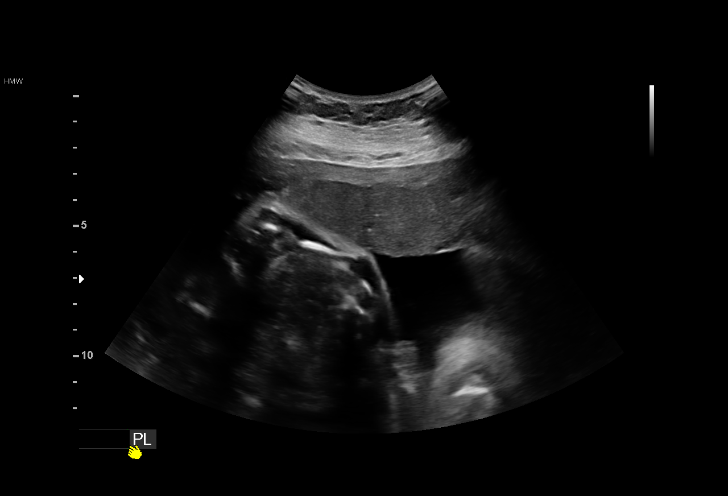
[im 25/55]
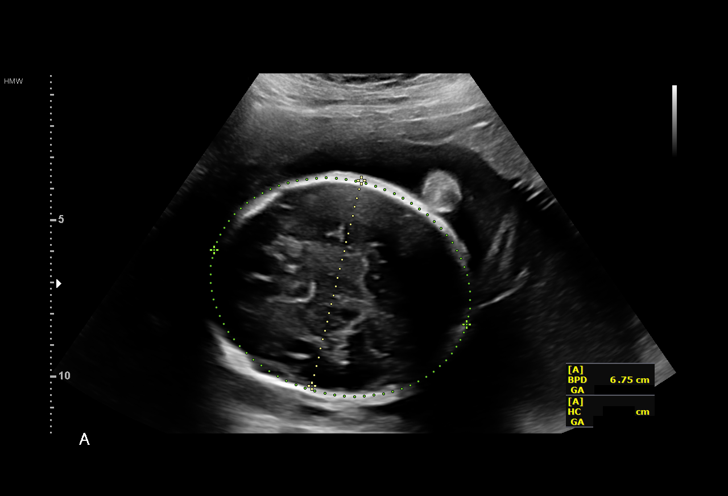
[im 31/55]
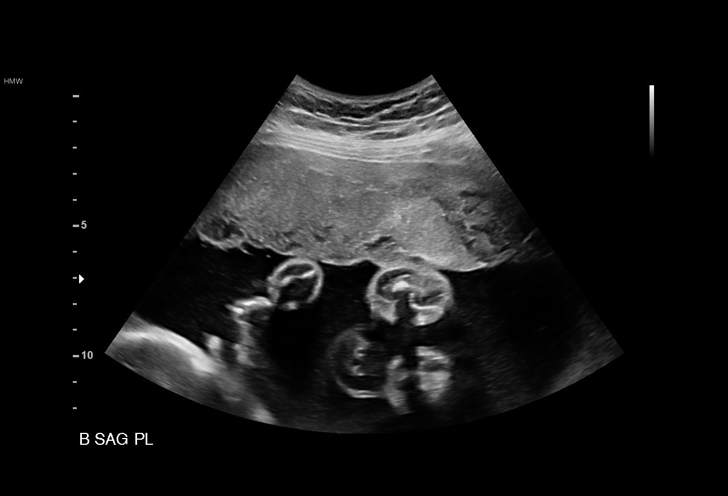
[im 35/55]
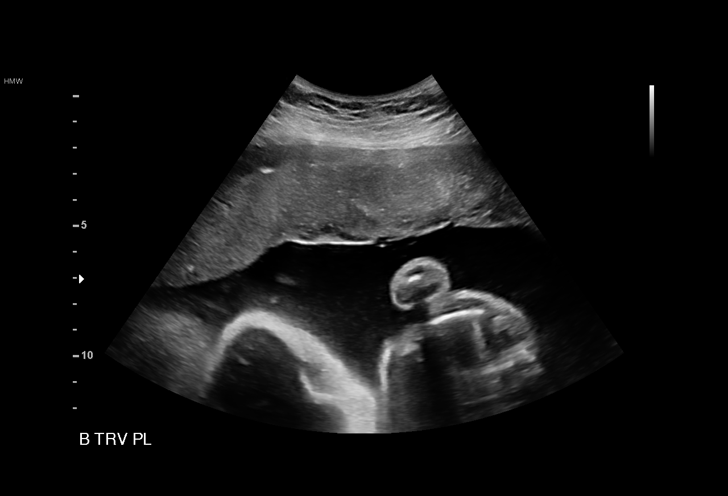
[im 39/55]
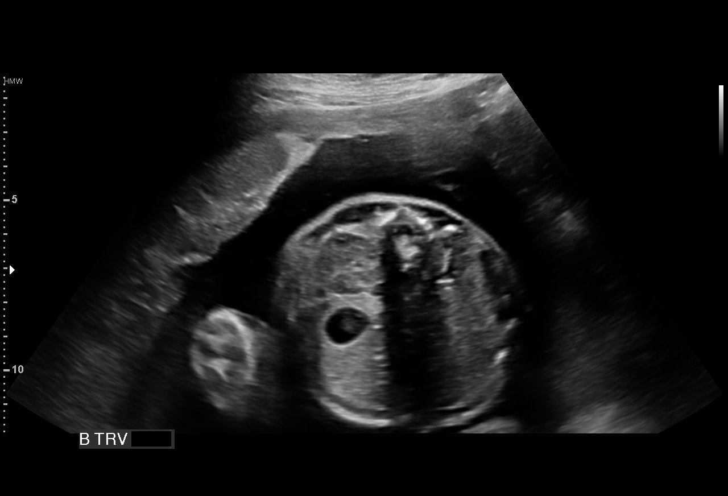
[im 45/55]
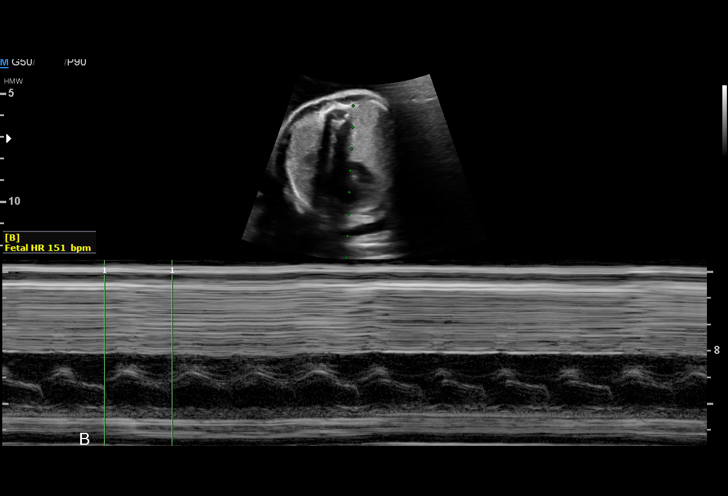
[im 49/55]
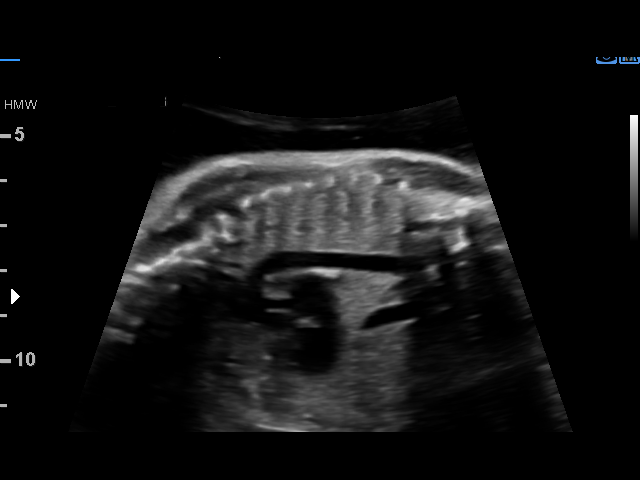
[im 53/55]
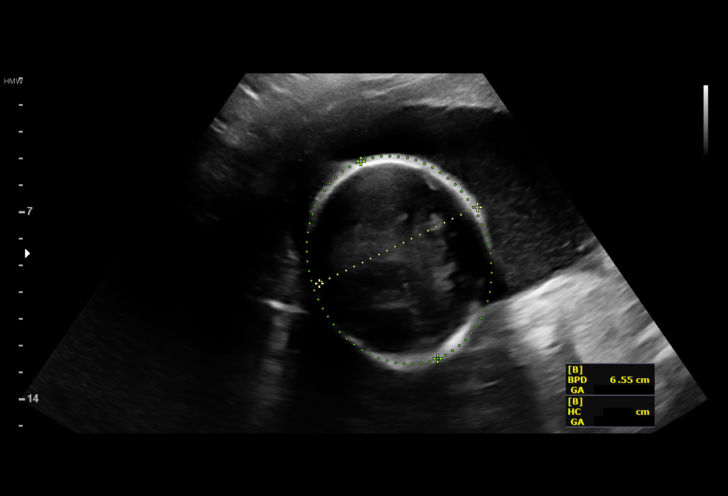

[12 of 28 positions shown; findings below may reference images not displayed]

[REDACTED].

1  BOLIVIN BONINI              [id number]      [id number]     [id number]
2  BOLIVIN BONINI              [id number]      [id number]     [id number]
Indications

27 weeks gestation of pregnancy
Twin pregnancy, [place]/Zoldhegyi, second trimester
Hypertension - Chronic/Pre-existing; no meds
Advanced maternal age multigravida 35+,
second trimester
Encounter for other antenatal screening
follow-up
OB History

Gravidity:    2         Term:   1
Living:       1
Fetal Evaluation (Fetus A)

Num Of Fetuses:     2
Fetal Heart         144
Rate(bpm):
Cardiac Activity:   Observed
Fetal Lie:          Maternal right side
Presentation:       Cephalic
Placenta:           Anterior, above cervical os
P. Cord Insertion:  Visualized, central
Membrane Desc:      Dividing Membrane seen

Amniotic Fluid
AFI FV:      Subjectively within normal limits

Largest Pocket(cm)
6.8
Biometry (Fetus A)

BPD:        68  mm     G. Age:  27w 3d         51  %    CI:        78.48   %   70 - 86
FL/HC:      20.1   %   18.6 -
HC:      242.8  mm     G. Age:  26w 3d          9  %    HC/AC:      1.05       1.05 -
AC:      231.1  mm     G. Age:  27w 4d         56  %    FL/BPD:     71.9   %   71 - 87
FL:       48.9  mm     G. Age:  26w 3d         21  %    FL/AC:      21.2   %   20 - 24
HUM:      44.6  mm     G. Age:  26w 3d         34  %

Est. FW:    2520  gm      2 lb 4 oz     53  %     FW Discordancy      0 \ 3 %
Gestational Age (Fetus A)

LMP:           27w 0d       Date:   09/19/16                 EDD:   06/26/17
U/S Today:     27w 0d                                        EDD:   06/26/17
Best:          27w 0d    Det. By:   LMP  (09/19/16)          EDD:   06/26/17
Anatomy (Fetus A)

Cranium:               Appears normal         Aortic Arch:            Previously seen
Cavum:                 Previously seen        Ductal Arch:            Previously seen
Ventricles:            Appears normal         Diaphragm:              Previously seen
Choroid Plexus:        Previously seen        Stomach:                Appears normal, left
sided
Cerebellum:            Previously seen        Abdomen:                Previously seen
Posterior Fossa:       Previously seen        Abdominal Wall:         Previously seen
Nuchal Fold:           Previously seen        Cord Vessels:           Previously seen
Face:                  Orbits and profile     Kidneys:                Appear normal
previously seen
Lips:                  Previously seen        Bladder:                Appears normal
Thoracic:              Appears normal         Spine:                  Appears normal
Heart:                 Previously seen        Upper Extremities:      Previously seen
RVOT:                  Previously seen        Lower Extremities:      Previously seen
LVOT:                  Previously seen

Other:  Fetus appears to be a female. Technically difficult due to fetal position.

Fetal Evaluation (Fetus B)

Num Of Fetuses:     2
Fetal Heart         151
Rate(bpm):
Cardiac Activity:   Observed
Fetal Lie:          Maternal left side
Presentation:       Breech
Placenta:           Anterior, above cervical os
P. Cord Insertion:  Velamentous insertion
Membrane Desc:      Dividing Membrane seen

Amniotic Fluid
AFI FV:      Subjectively within normal limits

Largest Pocket(cm)
7.5
Biometry (Fetus B)

BPD:        67  mm     G. Age:  27w 0d         38  %    CI:        78.49   %   70 - 86
FL/HC:      20.3   %   18.6 -
HC:      239.2  mm     G. Age:  26w 0d          5  %    HC/AC:      1.05       1.05 -
AC:      228.3  mm     G. Age:  27w 2d         48  %    FL/BPD:     72.5   %   71 - 87
FL:       48.6  mm     G. Age:  26w 2d         18  %    FL/AC:      21.3   %   20 - 24
HUM:      44.9  mm     G. Age:  26w 4d         38  %

Est. FW:     980  gm      2 lb 3 oz     48  %     FW Discordancy         3  %
Gestational Age (Fetus B)

LMP:           27w 0d       Date:   09/19/16                 EDD:   06/26/17
U/S Today:     26w 5d                                        EDD:   06/28/17
Best:          27w 0d    Det. By:   LMP  (09/19/16)          EDD:   06/26/17
Anatomy (Fetus B)

Cranium:               Appears normal         Aortic Arch:            Appears normal
Cavum:                 Previously seen        Ductal Arch:            Previously seen
Ventricles:            Previously seen        Diaphragm:              Previously seen
Choroid Plexus:        Previously seen        Stomach:                Appears normal, left
sided
Cerebellum:            Previously seen        Abdomen:                Previously seen
Posterior Fossa:       Previously seen        Abdominal Wall:         Previously seen
Nuchal Fold:           Previously seen        Cord Vessels:           Previously seen
Face:                  Profile previously     Kidneys:                Appear normal
seen
Lips:                  Previously seen        Bladder:                Appears normal
Thoracic:              Appears normal         Spine:                  Previously seen
Heart:                 Appears normal         Upper Extremities:      Previously seen
(4CH, axis, and situs
RVOT:                  Previously seen        Lower Extremities:      Previously seen
LVOT:                  Previously seen

Other:  Fetus appears to be a female. Heels and 5th digit previously
visualized.
Cervix Uterus Adnexa

Cervix
Length:            3.6  cm.
Normal appearance by transabdominal scan.

Uterus
No abnormality visualized.

Left Ovary
No adnexal mass visualized.

Right Ovary
No adnexal mass visualized.

Cul De Sac:   No free fluid seen.

Adnexa:       No abnormality visualized.
Impression

Mo / [place] gestation at 27 weeks 0 days gestation with fetal
cardiac activity x2
Cephalic / Breech presentation
Normal appearing fetal growth x2 and concordant MVP
Normal appearing cervical length
Recommendations

Recommend follow up serially for growth
Continue q2 week evaluation to screen for TTTS

## 2018-04-01 IMAGING — US US MFM OB FOLLOW-UP
1 series · 12 of 28 positions shown · non-contrast
Comparison: none

[Series 1: us mfm ob follow-up · 62 acquisitions, 12 frames shown]
[im 3/62]
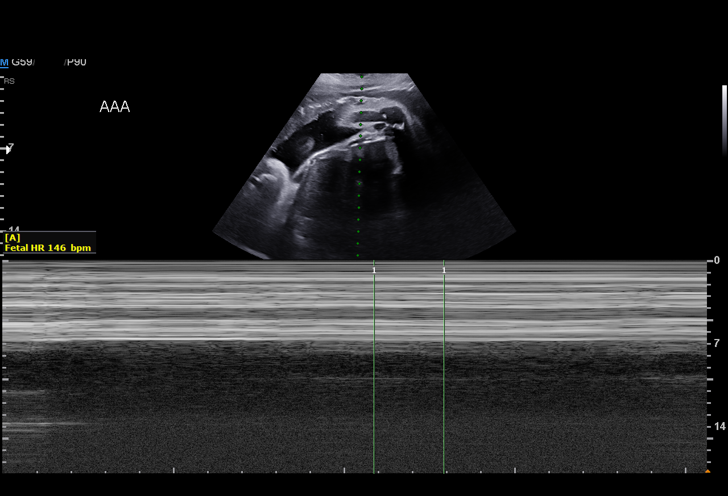
[im 7/62]
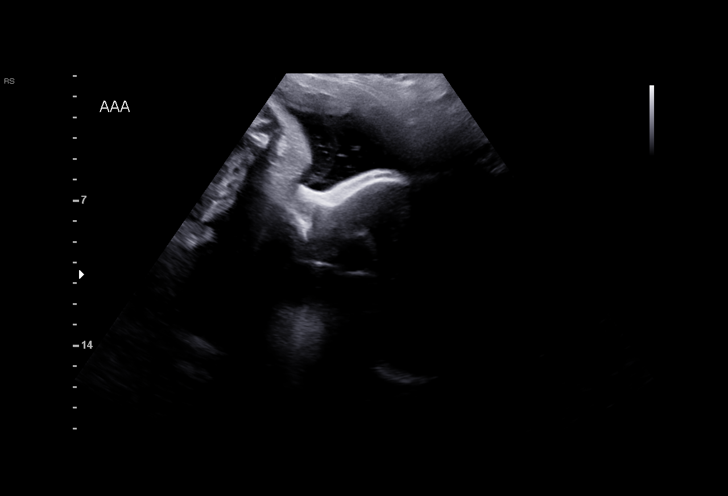
[im 12/62]
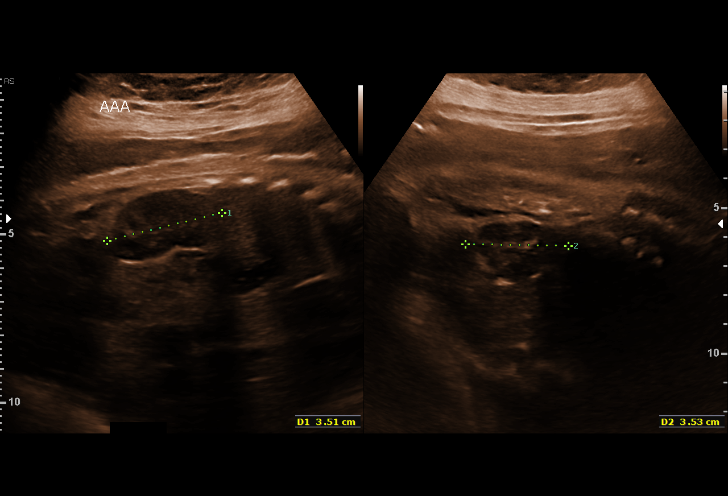
[im 19/62]
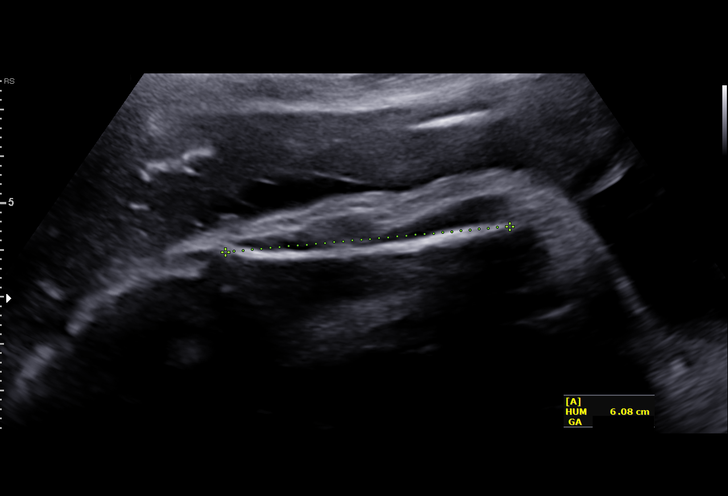
[im 23/62]
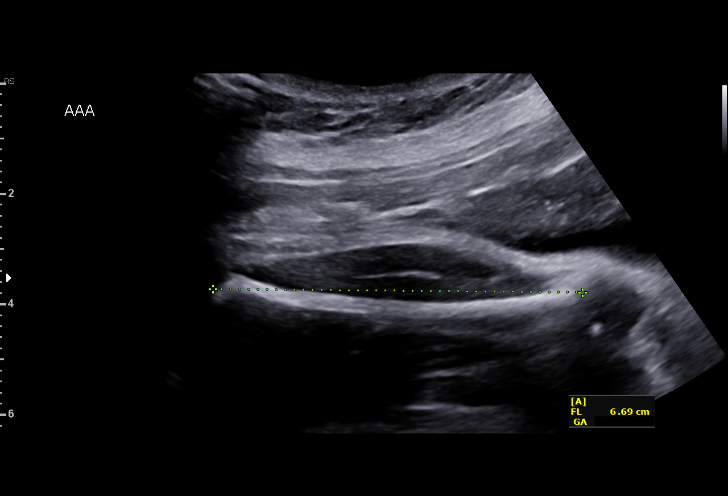
[im 28/62]
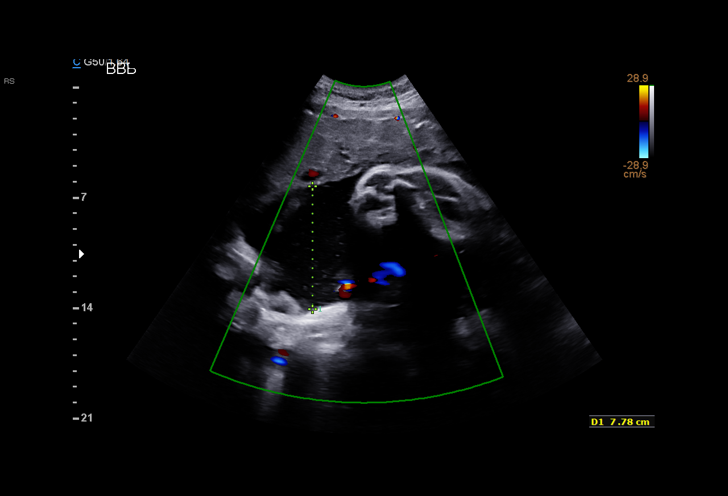
[im 34/62]
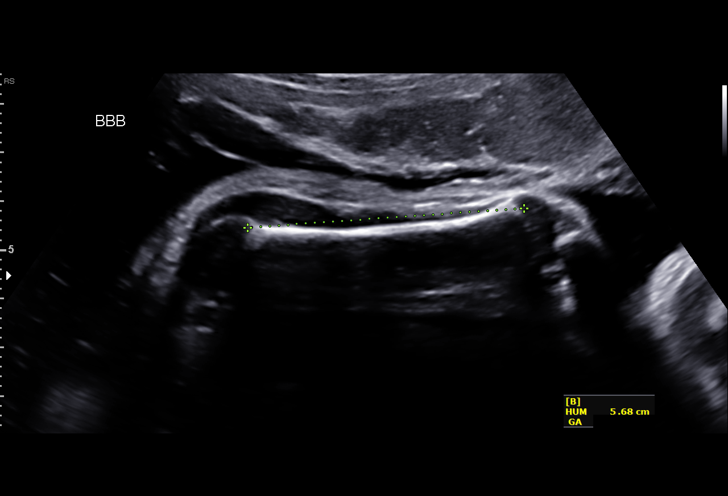
[im 39/62]
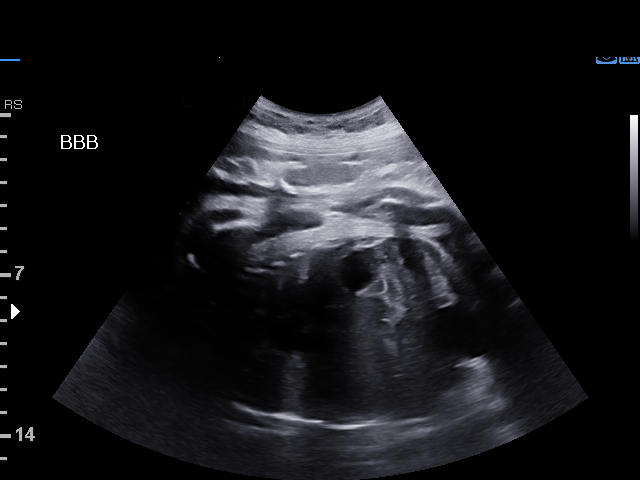
[im 43/62]
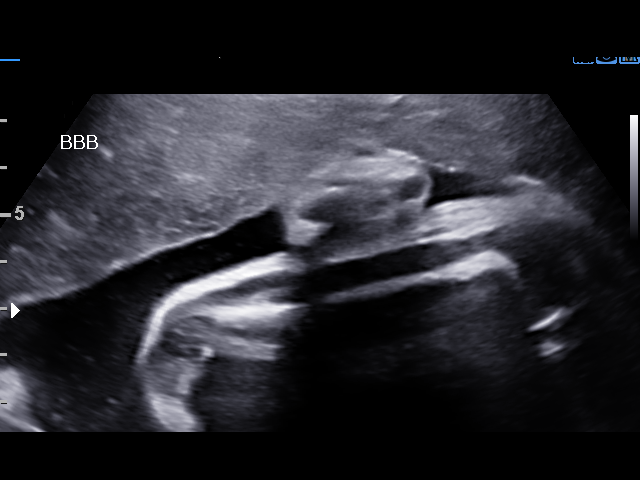
[im 50/62]
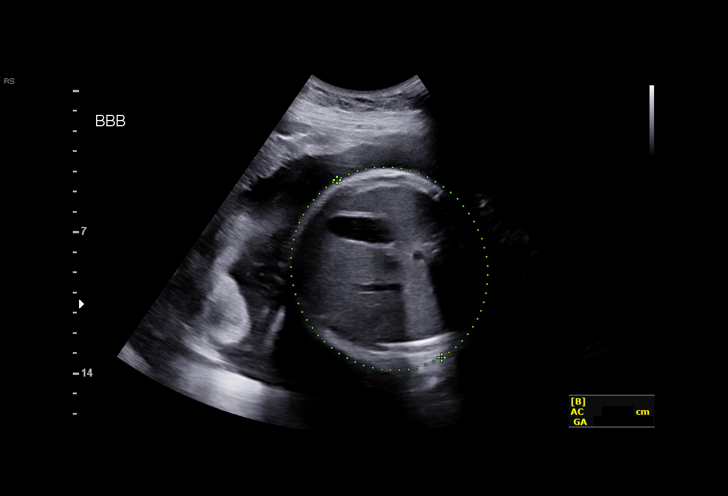
[im 55/62]
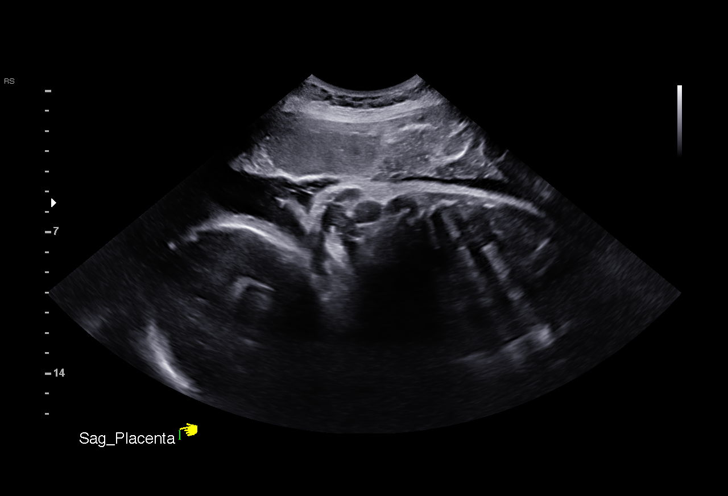
[im 59/62]
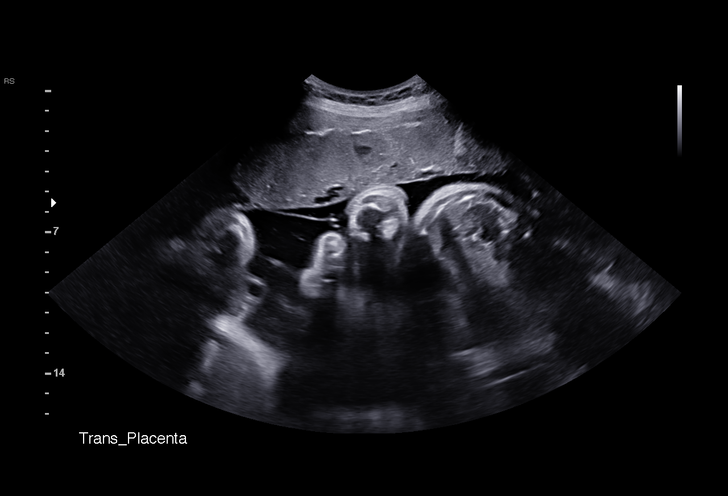

[12 of 28 positions shown; findings below may reference images not displayed]

[REDACTED].

1  KAMATCHI HAMRAN              [id number]      [id number]     [id number]
2  KAMATCHI HAMRAN              [id number]      [id number]     [id number]
Indications

35 weeks gestation of pregnancy
Twin pregnancy, Daw/Giselle, third trimester
Hypertension - Chronic/Pre-existing; no meds
Advanced maternal age multigravida 35+,
third trimester
OB History

Gravidity:    2         Term:   1
Living:       1
Fetal Evaluation (Fetus A)

Num Of Fetuses:     2
Fetal Heart         146
Rate(bpm):
Cardiac Activity:   Observed
Presentation:       Cephalic
Placenta:           Anterior, above cervical os
P. Cord Insertion:  Previously Visualized
Membrane Desc:      Dividing Membrane seen - Monochorionic

Amniotic Fluid
AFI FV:      Polyhydramnios

Largest Pocket(cm)
10.22
Biometry (Fetus A)
BPD:      84.2  mm     G. Age:  33w 6d         22  %    CI:        68.71   %   70 - 86
FL/HC:      20.6   %   20.1 -
HC:      324.6  mm     G. Age:  36w 5d         60  %    HC/AC:      1.05       0.93 -
AC:      308.3  mm     G. Age:  34w 5d         50  %    FL/BPD:     79.5   %   71 - 87
FL:       66.9  mm     G. Age:  34w 3d         28  %    FL/AC:      21.7   %   20 - 24
HUM:      60.2  mm     G. Age:  35w 0d         63  %

Est. FW:    0692  gm      5 lb 9 oz     57  %     FW Discordancy      0 \ 2 %
Gestational Age (Fetus A)

LMP:           35w 0d       Date:   09/19/16                 EDD:   06/26/17
U/S Today:     35w 0d                                        EDD:   06/26/17
Best:          35w 0d    Det. By:   LMP  (09/19/16)          EDD:   06/26/17
Anatomy (Fetus A)

Cranium:               Appears normal         Aortic Arch:            Previously seen
Cavum:                 Previously seen        Ductal Arch:            Previously seen
Ventricles:            Previously seen        Diaphragm:              Previously seen
Choroid Plexus:        Previously seen        Stomach:                Appears normal, left
sided
Cerebellum:            Previously seen        Abdomen:                Previously seen
Posterior Fossa:       Previously seen        Abdominal Wall:         Previously seen
Nuchal Fold:           Previously seen        Cord Vessels:           Previously seen
Face:                  Orbits and profile     Kidneys:                Appear normal
previously seen
Lips:                  Previously seen        Bladder:                Appears normal
Thoracic:              Appears normal         Spine:                  Previously seen
Heart:                 VSD per Fetal          Upper Extremities:      Previously seen
ECHO
RVOT:                  Previously seen        Lower Extremities:      Previously seen
LVOT:                  Previously seen

Other:  Female gender previously seen. Technically difficult due to fetal
position.

Fetal Evaluation (Fetus B)

Num Of Fetuses:     2
Fetal Heart         136
Rate(bpm):
Cardiac Activity:   Observed
Presentation:       Variable
Placenta:           Anterior, above cervical os
P. Cord Insertion:  Velamentous insertion,prev vis
Membrane Desc:      Dividing Membrane seen - Monochorionic

Amniotic Fluid
AFI FV:      Subjectively within normal limits

Largest Pocket(cm)
7.78
Biometry (Fetus B)

BPD:      86.9  mm     G. Age:  35w 0d         55  %    CI:        74.49   %   70 - 86
FL/HC:      19.8   %   20.1 -
HC:      319.6  mm     G. Age:  36w 0d         41  %    HC/AC:      1.02       0.93 -
AC:      312.3  mm     G. Age:  35w 1d         61  %    FL/BPD:     73.0   %   71 - 87
FL:       63.4  mm     G. Age:  32w 6d          4  %    FL/AC:      20.3   %   20 - 24
HUM:      55.4  mm     G. Age:  32w 2d         10  %

Est. FW:    6351  gm      5 lb 7 oz     53  %     FW Discordancy         2  %
Gestational Age (Fetus B)

LMP:           35w 0d       Date:   09/19/16                 EDD:   06/26/17
U/S Today:     34w 5d                                        EDD:   06/28/17
Best:          35w 0d    Det. By:   LMP  (09/19/16)          EDD:   06/26/17
Anatomy (Fetus B)

Cranium:               Appears normal         Aortic Arch:            Previously seen
Cavum:                 Previously seen        Ductal Arch:            Previously seen
Ventricles:            Previously seen        Diaphragm:              Previously seen
Choroid Plexus:        Previously seen        Stomach:                Appears normal, left
sided
Cerebellum:            Previously seen        Abdomen:                Previously seen
Posterior Fossa:       Previously seen        Abdominal Wall:         Previously seen
Nuchal Fold:           Previously seen        Cord Vessels:           Previously seen
Face:                  Profile previously     Kidneys:                Appear normal
seen
Lips:                  Previously seen        Bladder:                Appears normal
Thoracic:              Appears normal         Spine:                  Previously seen
Heart:                 Previously seen        Upper Extremities:      Previously seen
RVOT:                  Previously seen        Lower Extremities:      Previously seen
LVOT:                  Previously seen

Other:  Female gender previously seen. Heels and 5th digit previously
visualized. Technically difficult due to fetal position.
Cervix Uterus Adnexa

Cervix
Not visualized (advanced GA >66wks)

Uterus
No abnormality visualized.

Left Ovary
Not visualized.

Right Ovary
Not visualized.

Adnexa:       No abnormality visualized. No adnexal mass
visualized.
Impression

Monochorionic/diamniotic twin pregnancy at 35+0 weeks
concordant growth
criteria for TTTS not met
maternal hypertension 167/95

Twin A:
vertex
known VSD
EFW  57th%'le
polyhydramnios

Twin B
variable: transverse/breech
EFW 53rd%'le
no defects
normal fluid volume
Recommendations

Given onset of severe hypertension, patient sent to L&D for
preeclampsia evaluation.  If HTN is persistently severe,
delivery is warranted.  Likewise, if patient meets criteria for
preeclampsia, has a non-reassuring fetal heart tracing, lab
abnormalities, or any factor that would disqualify her as "an
uncomplicated [place] gestation", then delivery is
indicated given that she is now >34 weeks. If evaluation is
otherwise reassuring then continued expectant management
until 36-37 weeks is reasonable.  Navastia. Welfred and Learabea
are aware and agree with the needed evaluation.  Patient
sent to L&D.
